# Patient Record
Sex: Male | Born: 1955 | Race: White | Hispanic: No | Marital: Married | Smoking: Former smoker
Health system: Southern US, Community
[De-identification: ages and names within clinical notes are randomized; demographics above are authoritative.]

## PROBLEM LIST (undated history)

## (undated) DIAGNOSIS — I10 Essential (primary) hypertension: Secondary | ICD-10-CM

## (undated) DIAGNOSIS — J189 Pneumonia, unspecified organism: Secondary | ICD-10-CM

## (undated) DIAGNOSIS — J45909 Unspecified asthma, uncomplicated: Secondary | ICD-10-CM

## (undated) DIAGNOSIS — N189 Chronic kidney disease, unspecified: Secondary | ICD-10-CM

## (undated) DIAGNOSIS — M359 Systemic involvement of connective tissue, unspecified: Secondary | ICD-10-CM

## (undated) DIAGNOSIS — J449 Chronic obstructive pulmonary disease, unspecified: Secondary | ICD-10-CM

## (undated) DIAGNOSIS — I2510 Atherosclerotic heart disease of native coronary artery without angina pectoris: Secondary | ICD-10-CM

## (undated) DIAGNOSIS — K219 Gastro-esophageal reflux disease without esophagitis: Secondary | ICD-10-CM

## (undated) DIAGNOSIS — IMO0001 Reserved for inherently not codable concepts without codable children: Secondary | ICD-10-CM

## (undated) DIAGNOSIS — I219 Acute myocardial infarction, unspecified: Secondary | ICD-10-CM

## (undated) HISTORY — PX: CORONARY ANGIOPLASTY WITH STENT PLACEMENT: SHX49

## (undated) HISTORY — PX: APPENDECTOMY: SHX54

---

## 1981-01-21 DIAGNOSIS — J189 Pneumonia, unspecified organism: Secondary | ICD-10-CM

## 1981-01-21 HISTORY — DX: Pneumonia, unspecified organism: J18.9

## 2004-02-07 ENCOUNTER — Inpatient Hospital Stay: Payer: Self-pay | Admitting: Internal Medicine

## 2005-01-21 HISTORY — PX: APPENDECTOMY: SHX54

## 2010-01-21 HISTORY — PX: CORONARY ANGIOPLASTY WITH STENT PLACEMENT: SHX49

## 2010-02-21 ENCOUNTER — Inpatient Hospital Stay: Payer: Self-pay | Admitting: Internal Medicine

## 2014-06-21 ENCOUNTER — Observation Stay
Admission: EM | Admit: 2014-06-21 | Discharge: 2014-06-23 | Disposition: A | Discharge status: Home / Self Care | Payer: Self-pay | Attending: Internal Medicine | Admitting: Internal Medicine

## 2014-06-21 ENCOUNTER — Encounter: Payer: Self-pay | Admitting: *Deleted

## 2014-06-21 ENCOUNTER — Emergency Department: Payer: Self-pay

## 2014-06-21 DIAGNOSIS — R9389 Abnormal findings on diagnostic imaging of other specified body structures: Secondary | ICD-10-CM | POA: Diagnosis present

## 2014-06-21 DIAGNOSIS — Z8249 Family history of ischemic heart disease and other diseases of the circulatory system: Secondary | ICD-10-CM | POA: Insufficient documentation

## 2014-06-21 DIAGNOSIS — E785 Hyperlipidemia, unspecified: Secondary | ICD-10-CM | POA: Insufficient documentation

## 2014-06-21 DIAGNOSIS — M79602 Pain in left arm: Secondary | ICD-10-CM | POA: Insufficient documentation

## 2014-06-21 DIAGNOSIS — K769 Liver disease, unspecified: Secondary | ICD-10-CM

## 2014-06-21 DIAGNOSIS — R079 Chest pain, unspecified: Secondary | ICD-10-CM

## 2014-06-21 DIAGNOSIS — R918 Other nonspecific abnormal finding of lung field: Secondary | ICD-10-CM | POA: Insufficient documentation

## 2014-06-21 DIAGNOSIS — K219 Gastro-esophageal reflux disease without esophagitis: Secondary | ICD-10-CM | POA: Insufficient documentation

## 2014-06-21 DIAGNOSIS — Z79899 Other long term (current) drug therapy: Secondary | ICD-10-CM | POA: Insufficient documentation

## 2014-06-21 DIAGNOSIS — I2510 Atherosclerotic heart disease of native coronary artery without angina pectoris: Principal | ICD-10-CM | POA: Insufficient documentation

## 2014-06-21 DIAGNOSIS — N189 Chronic kidney disease, unspecified: Secondary | ICD-10-CM | POA: Insufficient documentation

## 2014-06-21 DIAGNOSIS — I714 Abdominal aortic aneurysm, without rupture: Secondary | ICD-10-CM | POA: Insufficient documentation

## 2014-06-21 DIAGNOSIS — D72829 Elevated white blood cell count, unspecified: Secondary | ICD-10-CM | POA: Insufficient documentation

## 2014-06-21 DIAGNOSIS — R0602 Shortness of breath: Secondary | ICD-10-CM | POA: Insufficient documentation

## 2014-06-21 DIAGNOSIS — I10 Essential (primary) hypertension: Secondary | ICD-10-CM | POA: Diagnosis present

## 2014-06-21 DIAGNOSIS — Z87891 Personal history of nicotine dependence: Secondary | ICD-10-CM | POA: Insufficient documentation

## 2014-06-21 DIAGNOSIS — R0789 Other chest pain: Secondary | ICD-10-CM | POA: Insufficient documentation

## 2014-06-21 DIAGNOSIS — Z801 Family history of malignant neoplasm of trachea, bronchus and lung: Secondary | ICD-10-CM | POA: Insufficient documentation

## 2014-06-21 DIAGNOSIS — I252 Old myocardial infarction: Secondary | ICD-10-CM | POA: Insufficient documentation

## 2014-06-21 DIAGNOSIS — I129 Hypertensive chronic kidney disease with stage 1 through stage 4 chronic kidney disease, or unspecified chronic kidney disease: Secondary | ICD-10-CM | POA: Insufficient documentation

## 2014-06-21 DIAGNOSIS — Z955 Presence of coronary angioplasty implant and graft: Secondary | ICD-10-CM | POA: Insufficient documentation

## 2014-06-21 DIAGNOSIS — R61 Generalized hyperhidrosis: Secondary | ICD-10-CM | POA: Insufficient documentation

## 2014-06-21 DIAGNOSIS — J449 Chronic obstructive pulmonary disease, unspecified: Secondary | ICD-10-CM | POA: Insufficient documentation

## 2014-06-21 HISTORY — DX: Gastro-esophageal reflux disease without esophagitis: K21.9

## 2014-06-21 HISTORY — DX: Chronic obstructive pulmonary disease, unspecified: J44.9

## 2014-06-21 HISTORY — DX: Chronic kidney disease, unspecified: N18.9

## 2014-06-21 HISTORY — DX: Reserved for inherently not codable concepts without codable children: IMO0001

## 2014-06-21 HISTORY — DX: Acute myocardial infarction, unspecified: I21.9

## 2014-06-21 HISTORY — DX: Systemic involvement of connective tissue, unspecified: M35.9

## 2014-06-21 HISTORY — DX: Atherosclerotic heart disease of native coronary artery without angina pectoris: I25.10

## 2014-06-21 HISTORY — DX: Essential (primary) hypertension: I10

## 2014-06-21 HISTORY — DX: Unspecified asthma, uncomplicated: J45.909

## 2014-06-21 LAB — CBC WITH DIFFERENTIAL/PLATELET
BASOS ABS: 0 10*3/uL (ref 0–0.1)
Basophils Relative: 0 %
Eosinophils Absolute: 0 10*3/uL (ref 0–0.7)
Eosinophils Relative: 0 %
HCT: 42.9 % (ref 40.0–52.0)
HEMOGLOBIN: 14.2 g/dL (ref 13.0–18.0)
Lymphocytes Relative: 9 %
Lymphs Abs: 1.4 10*3/uL (ref 1.0–3.6)
MCH: 28 pg (ref 26.0–34.0)
MCHC: 33.1 g/dL (ref 32.0–36.0)
MCV: 84.8 fL (ref 80.0–100.0)
MONOS PCT: 16 %
Monocytes Absolute: 2.6 10*3/uL — ABNORMAL HIGH (ref 0.2–1.0)
NEUTROS ABS: 12.1 10*3/uL — AB (ref 1.4–6.5)
Neutrophils Relative %: 75 %
Platelets: 277 10*3/uL (ref 150–440)
RBC: 5.06 MIL/uL (ref 4.40–5.90)
RDW: 14.5 % (ref 11.5–14.5)
WBC: 16.1 10*3/uL — ABNORMAL HIGH (ref 3.8–10.6)

## 2014-06-21 LAB — BASIC METABOLIC PANEL
Anion gap: 8 (ref 5–15)
BUN: 17 mg/dL (ref 6–20)
CO2: 27 mmol/L (ref 22–32)
Calcium: 9 mg/dL (ref 8.9–10.3)
Chloride: 101 mmol/L (ref 101–111)
Creatinine, Ser: 1.5 mg/dL — ABNORMAL HIGH (ref 0.61–1.24)
GFR calc Af Amer: 58 mL/min — ABNORMAL LOW (ref >60)
GFR, EST NON AFRICAN AMERICAN: 50 mL/min — AB (ref >60)
GLUCOSE: 113 mg/dL — AB (ref 65–99)
POTASSIUM: 3.8 mmol/L (ref 3.5–5.1)
Sodium: 136 mmol/L (ref 135–145)

## 2014-06-21 LAB — TROPONIN I: Troponin I: <0.03 ng/mL (ref <0.031)

## 2014-06-21 MED ORDER — IOHEXOL 350 MG/ML SOLN
75.0000 mL | Freq: Once | INTRAVENOUS | Status: AC | PRN
  Administered 2014-06-21: 75 mL via INTRAVENOUS

## 2014-06-21 MED ORDER — SODIUM CHLORIDE 0.9 % IV BOLUS (SEPSIS)
1000.0000 mL | Freq: Once | INTRAVENOUS | Status: AC
  Administered 2014-06-21: 1000 mL via INTRAVENOUS

## 2014-06-21 NOTE — ED Notes (Addendum)
Per EMS report, patient first experienced chest pain at 0530 this morning and has been on and off since then. Patient c/o shortness of breath this evening. Patient was given two sprays of nitro SL, first brought chest pain from 4/10 to 1/10, 2nd spray to 0/10. Patient has a hx. Of MI and one stent. Patient states he took (3) '325mg'$  Aspirin today, but has not taken Plavix for two weeks. Patient also c/o diaphoresis and left arm pain.

## 2014-06-21 NOTE — ED Provider Notes (Signed)
[facility] Emergency Department Provider Note  ____________________________________________  Time seen: Approximately 940 PM  I have reviewed the triage vital signs and the nursing notes.   HISTORY  Chief Complaint Chest Pain    HPI Terry Freeman is a 59 y.o. male with a history of CAD with a stent in 2012 who presents with aching left-sided chest pain which radiated to his back this morning at 5:30 AM. He said it has been coming and going throughout the day and was relieved by ranging his left arm. He said that he did have some diaphoresis associated with it but no nausea or vomiting. He said that this felt differently than his pain in 2012 the cousin 2012 his pain was associated with abdominal aching. He says he has not been on his Plavix for the past 2 weeks because he said he ran out and didn't pick it up at the pharmacy. He sees Dr. Yancey Flemings in call wouldn't of cardiology here at [facility]. He took 3, 325 mg aspirin throughout the day today. He then called EMS who gave him a nitroglycerin sublingual which relieved the pain. He is not claiming any pain at this time.   No past medical history on file.  There are no active problems to display for this patient.   No past surgical history on file.  No current outpatient prescriptions on file.  Allergies Review of patient's allergies indicates no known allergies.  No family history on file.  Social History History  Substance Use Topics  . Smoking status: Not on file  . Smokeless tobacco: Not on file  . Alcohol Use: Not on file    Review of Systems Constitutional: No fever/chills Eyes: No visual changes. ENT: No sore throat. Cardiovascular: Chest pain as above  Respiratory: Denies shortness of breath. Gastrointestinal: No abdominal pain.  No nausea, no vomiting.  No diarrhea.  No constipation. Genitourinary: Negative for dysuria. Musculoskeletal: Negative for back pain. Skin: Negative for  rash. Neurological: Negative for headaches, focal weakness or numbness.  10-point ROS otherwise negative.  ____________________________________________   PHYSICAL EXAM:  VITAL SIGNS: ED Triage Vitals  Enc Vitals Group     BP 06/21/14 2142 96/72 mmHg     Pulse Rate 06/21/14 2142 104     Resp --      Temp 06/21/14 2142 98.7 F (37.1 C)     Temp Source 06/21/14 2142 Oral     SpO2 06/21/14 2142 94 %     Weight 06/21/14 2142 218 lb (98.884 kg)     Height 06/21/14 2142 6' (1.829 m)     Head Cir --      Peak Flow --      Pain Score --      Pain Loc --      Pain Edu? --      Excl. in [place]? --     Constitutional: Alert and oriented. Well appearing and in no acute distress. Eyes: Conjunctivae are normal. PERRL. EOMI. Head: Atraumatic. Nose: No congestion/rhinnorhea. Mouth/Throat: Mucous membranes are moist.  Oropharynx non-erythematous. Neck: No stridor.   Cardiovascular: Normal rate, regular rhythm. Grossly normal heart sounds.  Good peripheral circulation. Respiratory: Normal respiratory effort.  No retractions. Lungs CTAB. Gastrointestinal: Soft and nontender. No distention. No abdominal bruits. No CVA tenderness. Musculoskeletal: No lower extremity tenderness nor edema.  No joint effusions. Neurologic:  Normal speech and language. No gross focal neurologic deficits are appreciated. Speech is normal. No gait instability. Skin:  Skin is warm,  dry and intact. No rash noted. Psychiatric: Mood and affect are normal. Speech and behavior are normal.  ____________________________________________   LABS (all labs ordered are listed, but only abnormal results are displayed)  Labs Reviewed  CBC WITH DIFFERENTIAL/PLATELET  BASIC METABOLIC PANEL  TROPONIN I   ____________________________________________  EKG  ED ECG REPORT I, Nadyne Gariepy,  Youlanda Roys, the attending physician, personally viewed and interpreted this ECG.   Date: 06/21/2014  EKG Time: 2135  Rate: 101  Rhythm: sinus  tachycardia  Axis: Terry Freeman  Intervals:none  ST&T Change: No ST elevation or depressions. No abnormal T-wave inversions.  ____________________________________________  RADIOLOGY  8.57 m left lung mass with small pleural effusion. ____________________________________________   PROCEDURES   ____________________________________________   INITIAL IMPRESSION / ASSESSMENT AND PLAN / ED COURSE  Pertinent labs & imaging results that were available during my care of the patient were reviewed by me and considered in my medical decision making (see chart for details). ----------------------------------------- 11:05 PM on 06/21/2014 -----------------------------------------  Patient resting comfortably still pain-free. Told the patient about his x-ray result. Said had some sort of "dead lung" at [facility] several years ago but never had any further imaging after that. I reviewed his imaging at [facility] which noted some atelectasis but no definite mass. Patient aware that he'll be getting a CAT scan of his chest. We'll also send second troponin for trending. Patient is high risk for CAD especially since he has been off his Plavix for 2 weeks and has a stent. Patient signed out to Dr. Owens Shark for follow-up of his imaging and labs and disposition. ____________________________________________   FINAL CLINICAL IMPRESSION(S) / ED DIAGNOSES  Left-sided chest pain. Acute , initial visit.    Orbie Pyo, MD 06/21/14 [phone]

## 2014-06-22 ENCOUNTER — Other Ambulatory Visit: Payer: Self-pay

## 2014-06-22 ENCOUNTER — Encounter: Payer: Self-pay | Admitting: Internal Medicine

## 2014-06-22 ENCOUNTER — Inpatient Hospital Stay
Admit: 2014-06-22 | Discharge: 2014-06-22 | Disposition: A | Discharge status: Home / Self Care | Payer: Self-pay | Attending: Internal Medicine | Admitting: Internal Medicine

## 2014-06-22 DIAGNOSIS — E785 Hyperlipidemia, unspecified: Secondary | ICD-10-CM | POA: Diagnosis present

## 2014-06-22 DIAGNOSIS — J449 Chronic obstructive pulmonary disease, unspecified: Secondary | ICD-10-CM | POA: Diagnosis present

## 2014-06-22 DIAGNOSIS — Z79899 Other long term (current) drug therapy: Secondary | ICD-10-CM

## 2014-06-22 DIAGNOSIS — R9389 Abnormal findings on diagnostic imaging of other specified body structures: Secondary | ICD-10-CM | POA: Diagnosis present

## 2014-06-22 DIAGNOSIS — R918 Other nonspecific abnormal finding of lung field: Secondary | ICD-10-CM

## 2014-06-22 DIAGNOSIS — N189 Chronic kidney disease, unspecified: Secondary | ICD-10-CM

## 2014-06-22 DIAGNOSIS — Z87891 Personal history of nicotine dependence: Secondary | ICD-10-CM

## 2014-06-22 DIAGNOSIS — I252 Old myocardial infarction: Secondary | ICD-10-CM

## 2014-06-22 DIAGNOSIS — I10 Essential (primary) hypertension: Secondary | ICD-10-CM | POA: Insufficient documentation

## 2014-06-22 DIAGNOSIS — I998 Other disorder of circulatory system: Secondary | ICD-10-CM

## 2014-06-22 DIAGNOSIS — I2510 Atherosclerotic heart disease of native coronary artery without angina pectoris: Principal | ICD-10-CM

## 2014-06-22 DIAGNOSIS — R42 Dizziness and giddiness: Secondary | ICD-10-CM

## 2014-06-22 DIAGNOSIS — K219 Gastro-esophageal reflux disease without esophagitis: Secondary | ICD-10-CM

## 2014-06-22 DIAGNOSIS — I129 Hypertensive chronic kidney disease with stage 1 through stage 4 chronic kidney disease, or unspecified chronic kidney disease: Secondary | ICD-10-CM

## 2014-06-22 DIAGNOSIS — J45909 Unspecified asthma, uncomplicated: Secondary | ICD-10-CM

## 2014-06-22 DIAGNOSIS — R079 Chest pain, unspecified: Secondary | ICD-10-CM

## 2014-06-22 DIAGNOSIS — D72829 Elevated white blood cell count, unspecified: Secondary | ICD-10-CM | POA: Diagnosis present

## 2014-06-22 LAB — COMPREHENSIVE METABOLIC PANEL
ALT: 30 U/L (ref 17–63)
ANION GAP: 7 (ref 5–15)
AST: 28 U/L (ref 15–41)
Albumin: 3.7 g/dL (ref 3.5–5.0)
Alkaline Phosphatase: 63 U/L (ref 38–126)
BUN: 17 mg/dL (ref 6–20)
CALCIUM: 8.7 mg/dL — AB (ref 8.9–10.3)
CHLORIDE: 103 mmol/L (ref 101–111)
CO2: 27 mmol/L (ref 22–32)
Creatinine, Ser: 1.31 mg/dL — ABNORMAL HIGH (ref 0.61–1.24)
GFR calc Af Amer: >60 mL/min (ref >60)
GFR calc non Af Amer: 59 mL/min — ABNORMAL LOW (ref >60)
Glucose, Bld: 114 mg/dL — ABNORMAL HIGH (ref 65–99)
POTASSIUM: 3.6 mmol/L (ref 3.5–5.1)
Sodium: 137 mmol/L (ref 135–145)
TOTAL PROTEIN: 7.1 g/dL (ref 6.5–8.1)
Total Bilirubin: 1.3 mg/dL — ABNORMAL HIGH (ref 0.3–1.2)

## 2014-06-22 LAB — SEDIMENTATION RATE: Sed Rate: 17 mm/hr (ref 0–20)

## 2014-06-22 LAB — CBC
HCT: 41.5 % (ref 40.0–52.0)
HEMOGLOBIN: 14.4 g/dL (ref 13.0–18.0)
MCH: 29.4 pg (ref 26.0–34.0)
MCHC: 34.7 g/dL (ref 32.0–36.0)
MCV: 84.7 fL (ref 80.0–100.0)
Platelets: 246 10*3/uL (ref 150–440)
RBC: 4.9 MIL/uL (ref 4.40–5.90)
RDW: 14.4 % (ref 11.5–14.5)
WBC: 13.5 10*3/uL — AB (ref 3.8–10.6)

## 2014-06-22 LAB — TROPONIN I
Troponin I: <0.03 ng/mL (ref <0.031)
Troponin I: <0.03 ng/mL (ref <0.031)

## 2014-06-22 LAB — TSH: TSH: 0.762 u[IU]/mL (ref 0.350–4.500)

## 2014-06-22 MED ORDER — ALUM & MAG HYDROXIDE-SIMETH 200-200-20 MG/5ML PO SUSP
ORAL | Status: AC
  Filled 2014-06-22: qty 30

## 2014-06-22 MED ORDER — SODIUM CHLORIDE 0.9 % IJ SOLN: 3.0000 mL | INTRAMUSCULAR | Status: DC | PRN

## 2014-06-22 MED ORDER — ALBUTEROL SULFATE (2.5 MG/3ML) 0.083% IN NEBU: 2.5000 mg | INHALATION_SOLUTION | RESPIRATORY_TRACT | Status: DC | PRN

## 2014-06-22 MED ORDER — CLOPIDOGREL BISULFATE 75 MG PO TABS
75.0000 mg | ORAL_TABLET | Freq: Every day | ORAL | Status: DC
  Administered 2014-06-22 – 2014-06-23 (×2): 75 mg via ORAL
  Filled 2014-06-22 (×2): qty 1

## 2014-06-22 MED ORDER — HEPARIN SODIUM (PORCINE) 5000 UNIT/ML IJ SOLN
5000.0000 [IU] | Freq: Three times a day (TID) | INTRAMUSCULAR | Status: DC
  Administered 2014-06-22 – 2014-06-23 (×3): 5000 [IU] via SUBCUTANEOUS
  Filled 2014-06-22 (×4): qty 1

## 2014-06-22 MED ORDER — ACETAMINOPHEN 650 MG RE SUPP: 650.0000 mg | Freq: Four times a day (QID) | RECTAL | Status: DC | PRN

## 2014-06-22 MED ORDER — ACETAMINOPHEN 325 MG PO TABS: 650.0000 mg | ORAL_TABLET | Freq: Four times a day (QID) | ORAL | Status: DC | PRN

## 2014-06-22 MED ORDER — ASPIRIN EC 81 MG PO TBEC
81.0000 mg | DELAYED_RELEASE_TABLET | Freq: Every day | ORAL | Status: DC
  Administered 2014-06-22 – 2014-06-23 (×2): 81 mg via ORAL
  Filled 2014-06-22 (×2): qty 1

## 2014-06-22 MED ORDER — SODIUM CHLORIDE 0.9 % IJ SOLN: 3.0000 mL | Freq: Two times a day (BID) | INTRAMUSCULAR | Status: DC

## 2014-06-22 MED ORDER — BUDESONIDE-FORMOTEROL FUMARATE 160-4.5 MCG/ACT IN AERO
2.0000 | INHALATION_SPRAY | Freq: Two times a day (BID) | RESPIRATORY_TRACT | Status: DC
  Administered 2014-06-22 – 2014-06-23 (×3): 2 via RESPIRATORY_TRACT
  Filled 2014-06-22 (×2): qty 6

## 2014-06-22 MED ORDER — SODIUM CHLORIDE 0.9 % IJ SOLN
3.0000 mL | Freq: Two times a day (BID) | INTRAMUSCULAR | Status: DC
  Administered 2014-06-22 – 2014-06-23 (×2): 3 mL via INTRAVENOUS

## 2014-06-22 MED ORDER — SIMVASTATIN 40 MG PO TABS
40.0000 mg | ORAL_TABLET | Freq: Every day | ORAL | Status: DC
  Administered 2014-06-22: 40 mg via ORAL
  Filled 2014-06-22 (×2): qty 1

## 2014-06-22 MED ORDER — METOPROLOL TARTRATE 25 MG PO TABS
12.5000 mg | ORAL_TABLET | Freq: Every day | ORAL | Status: DC
  Administered 2014-06-22 – 2014-06-23 (×2): 12.5 mg via ORAL
  Filled 2014-06-22 (×2): qty 1

## 2014-06-22 MED ORDER — FAMOTIDINE 20 MG PO TABS
40.0000 mg | ORAL_TABLET | Freq: Every day | ORAL | Status: DC
  Administered 2014-06-22 – 2014-06-23 (×2): 40 mg via ORAL
  Filled 2014-06-22 (×2): qty 2

## 2014-06-22 MED ORDER — ONDANSETRON HCL 4 MG PO TABS: 4.0000 mg | ORAL_TABLET | Freq: Four times a day (QID) | ORAL | Status: DC | PRN

## 2014-06-22 MED ORDER — ALUM & MAG HYDROXIDE-SIMETH 200-200-20 MG/5ML PO SUSP
30.0000 mL | Freq: Once | ORAL | Status: AC
  Administered 2014-06-22: 30 mL via ORAL

## 2014-06-22 MED ORDER — ONDANSETRON HCL 4 MG/2ML IJ SOLN: 4.0000 mg | Freq: Four times a day (QID) | INTRAMUSCULAR | Status: DC | PRN

## 2014-06-22 MED ORDER — SODIUM CHLORIDE 0.9 % IV SOLN: 250.0000 mL | INTRAVENOUS | Status: DC | PRN

## 2014-06-22 NOTE — Progress Notes (Signed)
*  PRELIMINARY RESULTS* Echocardiogram 2D Echocardiogram has been performed.  Terry Freeman 06/22/2014, 9:29 AM

## 2014-06-22 NOTE — Plan of Care (Signed)
Problem: Phase I Progression Outcomes Goal: Anginal pain relieved Outcome: Progressing Patient admittaed with chest pain,troponins negative,hemodynamically stable,denies pain on admission,normal sinus rhythm. Goal: Other Phase I Outcomes/Goals Outcome: Progressing Patient admitted with chest pain,troponins negative,denies pain upon admission,vital signs stable.

## 2014-06-22 NOTE — Consult Note (Signed)
[place] @ [facility] [phone]  Fax:([phone]) [place] OB: 02/06/1955  MR#: [id number]  QQP#:[id number]  Patient Care Team: Teodoro Spray, MD as Consulting Physician (Cardiology)  CHIEF COMPLAINT:  Chief Complaint  Patient presents with  . Chest Pain    VISIT DIAGNOSIS:     ICD-9-CM ICD-10-CM   1. Chest pain, unspecified chest pain type 786.50 R07.9   2. Lung mass 786.6 R91.8 CT Chest W Contrast     CT Chest W Contrast     NM PET Image Initial (PI) Skull Base To Thigh     NM PET Image Initial (PI) Skull Base To Thigh  3. Liver lesion 573.8 K76.89 NM PET Image Initial (PI) Skull Base To Thigh     NM PET Image Initial (PI) Skull Base To Thigh      No history exists.    No flowsheet data found.  INTERVAL HISTORY: 59 year old gentleman who developed acute chest pain and left upper lobe chest pain along with dizziness.  Patient was brought to emergency room where CT scan of the chest was abnormal.  Patient was admitted in the hospital.  Pain has resolved.  Patient has a previous history of cardiac disease and stent placement in 2014. Patient has smoked in the past for 20 years but has not smoked for last several years now. No significant weight loss.  No hemoptysis. REVIEW OF SYSTEMS:   GENERAL:  Feels good.  Active.  No fevers, sweats or weight loss. PERFORMANCE STATUS (ECOG):  0 HEENT:  No visual changes, runny nose, sore throat, mouth sores or tenderness. Lungs: No shortness of breath or cough.  No hemoptysis. 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  We do not have any records available Cardiac:  No chest pain, palpitations, orthopnea, or PND. GI:  No nausea, vomiting, diarrhea, constipation, melena or hematochezia. GU:  No urgency, frequency, dysuria, or hematuria. Musculoskeletal:   No back pain.  No joint pain.  No muscle tenderness. Extremities:  No pain or swelling. Skin:  No rashes or skin changes. Neuro:  No headache, numbness or weakness, balance or coordination issues. Endocrine:  No diabetes, thyroid issues, hot flashes or night sweats. Psych:  No mood changes, depression or anxiety. Pain:  No focal pain. Review of systems:  All other systems reviewed and found to be negative.  As per HPI. Otherwise, a complete review of systems is negatve.  PAST MEDICAL HISTORY: Past Medical History  Diagnosis Date  . COPD (chronic obstructive pulmonary disease)   . Hypertension   . Reflux   . Coronary artery disease   . Myocardial infarction   . Asthma   . Collagen vascular disease   . Chronic kidney disease     PAST SURGICAL HISTORY: Past Surgical History  Procedure Laterality Date  . Appendectomy    . Coronary angioplasty with stent placement      FAMILY HISTORY Family History  Problem Relation Age of Onset  . Cancer - Lung Mother   . CAD Mother   . Cancer - Lung Father        ADVANCED DIRECTIVES: No advance care directive available   HEALTH MAINTENANCE: History  Substance Use Topics  . Smoking status: Former Research scientist (life sciences)  . Smokeless tobacco: Not on file  .  Alcohol Use: No       No Known Allergies  Current Facility-Administered Medications  Medication Dose Route Frequency Provider Last Rate Last Dose  . 0.9 %  sodium chloride infusion  250 mL Intravenous PRN Juluis Mire, MD      . acetaminophen (TYLENOL) tablet 650 mg  650 mg Oral Q6H PRN Juluis Mire, MD       Or  . acetaminophen (TYLENOL) suppository 650 mg  650 mg Rectal Q6H PRN Juluis Mire, MD      . albuterol (PROVENTIL) (2.5 MG/3ML) 0.083% nebulizer solution 2.5 mg  2.5 mg Nebulization Q2H PRN Juluis Mire, MD      . aspirin EC tablet 81 mg  81 mg Oral Daily Juluis Mire, MD   81 mg at 06/22/14 1012  . budesonide-formoterol (SYMBICORT) 160-4.5 MCG/ACT inhaler 2  puff  2 puff Inhalation BID Juluis Mire, MD   2 puff at 06/22/14 1356  . clopidogrel (PLAVIX) tablet 75 mg  75 mg Oral Daily Juluis Mire, MD   75 mg at 06/22/14 1012  . famotidine (PEPCID) tablet 40 mg  40 mg Oral Daily Juluis Mire, MD   40 mg at 06/22/14 1012  . heparin injection 5,000 Units  5,000 Units Subcutaneous 3 times per day Juluis Mire, MD   5,000 Units at 06/22/14 1356  . metoprolol tartrate (LOPRESSOR) tablet 12.5 mg  12.5 mg Oral Daily Juluis Mire, MD   12.5 mg at 06/22/14 1012  . ondansetron (ZOFRAN) tablet 4 mg  4 mg Oral Q6H PRN Juluis Mire, MD       Or  . ondansetron [place]) injection 4 mg  4 mg Intravenous Q6H PRN Juluis Mire, MD      . simvastatin (ZOCOR) tablet 40 mg  40 mg Oral Daily Juluis Mire, MD      . sodium chloride 0.9 % injection 3 mL  3 mL Intravenous Q12H Juluis Mire, MD   3 mL at 06/22/14 1000  . sodium chloride 0.9 % injection 3 mL  3 mL Intravenous Q12H Juluis Mire, MD   3 mL at 06/22/14 1000  . sodium chloride 0.9 % injection 3 mL  3 mL Intravenous PRN Juluis Mire, MD        OBJECTIVE: PHYSICAL EXAM: GENERAL:  Well developed, well nourished, sitting comfortably in the exam room in no acute distress. MENTAL STATUS:  Alert and oriented to person, place and time. HEAD Normocephalic, atraumatic, face symmetric, no Cushingoid features. EYES.  Pupils equal round and reactive to light and accomodation.  No conjunctivitis or scleral icterus. ENT:  Oropharynx clear without lesion.  Tongue normal. Mucous membranes moist.  RESPIRATORY:  Clear to auscultation without rales, wheezes or rhonchi. CARDIOVASCULAR:  Regular rate and rhythm without murmur, rub or gallop. BREAST:  Right breast without masses, skin changes or nipple discharge.  Left breast without masses, skin changes or nipple discharge. ABDOMEN:  Soft, non-tender, with active bowel sounds, and no hepatosplenomegaly.  No masses. BACK:  No CVA tenderness.   No tenderness on percussion of the back or rib cage. SKIN:  No rashes, ulcers or lesions. EXTREMITIES: No edema, no skin discoloration or tenderness.  No palpable cords. LYMPH NODES: No palpable cervical, supraclavicular, axillary or inguinal adenopathy  NEUROLOGICAL: Unremarkable. PSYCH:  Appropriate.  Filed Vitals:   06/22/14 1207  BP: 115/64  Pulse: 86  Temp: 98.5 F (36.9 C)  Resp: 20  Body mass index is 29.59 kg/(m^2).    ECOG FS:0 - Asymptomatic  LAB RESULTS:  Admission on 06/21/2014  Component Date Value Ref Range Status  . WBC 06/21/2014 16.1* 3.8 - 10.6 K/uL Final  . RBC 06/21/2014 5.06  4.40 - 5.90 MIL/uL Final  . Hemoglobin 06/21/2014 14.2  13.0 - 18.0 g/dL Final  . HCT 06/21/2014 42.9  40.0 - 52.0 % Final  . MCV 06/21/2014 84.8  80.0 - 100.0 fL Final  . MCH 06/21/2014 28.0  26.0 - 34.0 pg Final  . MCHC 06/21/2014 33.1  32.0 - 36.0 g/dL Final  . RDW 06/21/2014 14.5  11.5 - 14.5 % Final  . Platelets 06/21/2014 277  150 - 440 K/uL Final  . Neutrophils Relative % 06/21/2014 75   Final  . Lymphocytes Relative 06/21/2014 9   Final  . Monocytes Relative 06/21/2014 16   Final  . Eosinophils Relative 06/21/2014 0   Final  . Basophils Relative 06/21/2014 0   Final  . Neutro Abs 06/21/2014 12.1* 1.4 - 6.5 K/uL Final  . Lymphs Abs 06/21/2014 1.4  1.0 - 3.6 K/uL Final  . Monocytes Absolute 06/21/2014 2.6* 0.2 - 1.0 K/uL Final  . Eosinophils Absolute 06/21/2014 0.0  0 - 0.7 K/uL Final  . Basophils Absolute 06/21/2014 0.0  0 - 0.1 K/uL Final  . Sodium 06/21/2014 136  135 - 145 mmol/L Final  . Potassium 06/21/2014 3.8  3.5 - 5.1 mmol/L Final  . Chloride 06/21/2014 101  101 - 111 mmol/L Final  . CO2 06/21/2014 27  22 - 32 mmol/L Final  . Glucose, Bld 06/21/2014 113* 65 - 99 mg/dL Final  . BUN 06/21/2014 17  6 - 20 mg/dL Final  . Creatinine, Ser 06/21/2014 1.50* 0.61 - 1.24 mg/dL Final  . Calcium 06/21/2014 9.0  8.9 - 10.3 mg/dL Final  . GFR calc non Af Amer 06/21/2014 50*  >60 mL/min Final  . GFR calc Af Amer 06/21/2014 58* >60 mL/min Final   Comment: (NOTE) The eGFR has been calculated using the CKD EPI equation. This calculation has not been validated in all clinical situations. eGFR's persistently <60 mL/min signify possible Chronic Kidney Disease.   . Anion gap 06/21/2014 8  5 - 15 Final  . Troponin I 06/21/2014 <0.03  <0.031 ng/mL Final   Comment:        NO INDICATION OF MYOCARDIAL INJURY.   . Troponin I 06/22/2014 <0.03  <0.031 ng/mL Final   Comment:        NO INDICATION OF MYOCARDIAL INJURY.   Marland Kitchen Specimen Description 06/22/2014 BLOOD   Final  . Special Requests 06/22/2014 NONE   Final  . Culture 06/22/2014 NO GROWTH < 12 HOURS   Final  . Report Status 06/22/2014 PENDING   Incomplete  . Specimen Description 06/22/2014 BLOOD   Final  . Special Requests 06/22/2014 NONE   Final  . Culture 06/22/2014 NO GROWTH < 12 HOURS   Final  . Report Status 06/22/2014 PENDING   Incomplete  . TSH 06/22/2014 0.762  0.350 - 4.500 uIU/mL Final  . Troponin I 06/22/2014 <0.03  <0.031 ng/mL Final   Comment:        NO INDICATION OF MYOCARDIAL INJURY.   . Troponin I 06/22/2014 <0.03  <0.031 ng/mL Final   Comment:        NO INDICATION OF MYOCARDIAL INJURY.   . Sodium 06/22/2014 137  135 - 145 mmol/L Final  . Potassium 06/22/2014 3.6  3.5 - 5.1 mmol/L  Final  . Chloride 06/22/2014 103  101 - 111 mmol/L Final  . CO2 06/22/2014 27  22 - 32 mmol/L Final  . Glucose, Bld 06/22/2014 114* 65 - 99 mg/dL Final  . BUN 06/22/2014 17  6 - 20 mg/dL Final  . Creatinine, Ser 06/22/2014 1.31* 0.61 - 1.24 mg/dL Final  . Calcium 06/22/2014 8.7* 8.9 - 10.3 mg/dL Final  . Total Protein 06/22/2014 7.1  6.5 - 8.1 g/dL Final  . Albumin 06/22/2014 3.7  3.5 - 5.0 g/dL Final  . AST 06/22/2014 28  15 - 41 U/L Final  . ALT 06/22/2014 30  17 - 63 U/L Final  . Alkaline Phosphatase 06/22/2014 63  38 - 126 U/L Final  . Total Bilirubin 06/22/2014 1.3* 0.3 - 1.2 mg/dL Final  . GFR calc non  Af Amer 06/22/2014 59* >60 mL/min Final  . GFR calc Af Amer 06/22/2014 >60  >60 mL/min Final   Comment: (NOTE) The eGFR has been calculated using the CKD EPI equation. This calculation has not been validated in all clinical situations. eGFR's persistently <60 mL/min signify possible Chronic Kidney Disease.   . Anion gap 06/22/2014 7  5 - 15 Final  . WBC 06/22/2014 13.5* 3.8 - 10.6 K/uL Final  . RBC 06/22/2014 4.90  4.40 - 5.90 MIL/uL Final  . Hemoglobin 06/22/2014 14.4  13.0 - 18.0 g/dL Final  . HCT 06/22/2014 41.5  40.0 - 52.0 % Final  . MCV 06/22/2014 84.7  80.0 - 100.0 fL Final  . MCH 06/22/2014 29.4  26.0 - 34.0 pg Final  . MCHC 06/22/2014 34.7  32.0 - 36.0 g/dL Final  . RDW 06/22/2014 14.4  11.5 - 14.5 % Final  . Platelets 06/22/2014 246  150 - 440 K/uL Final  . Sed Rate 06/22/2014 17  0 - 20 mm/hr Final     STUDIES: Dg Chest 2 View  06/21/2014   CLINICAL DATA:  Chest pain and shortness of breath.  EXAM: CHEST  2 VIEW  COMPARISON:  02/21/2010 chest radiograph  FINDINGS: An approximately 8.5 cm opacity overlying the medial anterior left lung is noted. Chest CT is recommended for further evaluation.  COPD/emphysema is identified.  Right apical opacity/scarring is noted.  A probable small effusion is noted.  There is no evidence of pneumothorax or acute bony abnormality.  IMPRESSION: 8.5 cm opacity overlying the medial anterior left lung- chest CT is recommended for further evaluation.  Question small pleural effusion.  COPD/ emphysema.   Electronically Signed   By: Margarette Canada M.D.   On: 06/21/2014 22:22   Ct Chest W Contrast  06/21/2014   CLINICAL DATA:  Acute onset of intermittent chest pain and shortness of breath. Diaphoresis and left arm pain. Initial encounter.  EXAM: CT CHEST WITH CONTRAST  TECHNIQUE: Multidetector CT imaging of the chest was performed during intravenous contrast administration.  CONTRAST:  59m OMNIPAQUE IOHEXOL 350 MG/ML SOLN  COMPARISON:  Chest radiograph  performed earlier today at 10:01 p.m.  FINDINGS: There is a large 9.2 x 8.2 x 6.5 cm complex cyst with multiple septations at the anterior left lateral aspect of the mediastinum. The differential for a cystic mass in the mediastinum includes a pericardial cyst, thymic cyst, lymphangioma or possibly cystic teratoma. Its location is atypical for other etiologies. Malignancy is considered less likely, though it appears associated with scarring and pleural thickening near the left lung apex. Mild associated soft tissue edema is noted within the adjacent fat.  Diffuse emphysematous change is noted  bilaterally, with large bullae seen at the lung apices. Focal opacification at the right lung apex measures 5.7 x 2.4 cm, with surrounding scarring, pleural thickening and multiple associated nodules measuring up to 9 mm in size. Given the scattered nodules within the right upper lobe, this is suspicious for primary malignancy. Biopsy and PET/CT would be helpful for further evaluation, as deemed clinically appropriate.  A trace left pleural effusion is noted. No additional pulmonary nodules are seen within the remaining lung lobes. No pneumothorax is identified.  Scattered coronary artery calcifications are seen. The mediastinum is otherwise grossly unremarkable. No mediastinal lymphadenopathy is seen. A 0.9 cm node at the aortopulmonary window remains within normal limits in size. The great vessels are unremarkable in appearance. The thyroid gland is unremarkable. No axillary lymphadenopathy is seen.  The visualized portions of the liver and spleen are unremarkable. Minimally increased attenuation dependently within the gallbladder could reflect small stones. The visualized portions of the gallbladder, pancreas, adrenal glands and left kidney are otherwise unremarkable.  IMPRESSION: 1. Focal opacification at the right lung apex measures 5.7 x 2.4 cm, with surrounding scarring, pleural thickening, and multiple associated  nodules measuring up to 9 mm in size. Given the scattered nodules in the right upper lobe, this suspicious for primary malignancy. Biopsy and PET/CT would be helpful for further evaluation, as deemed clinically appropriate. 2. Large 9.2 x 8.2 x 6.5 cm complex cyst with multiple septations at the anterior left lateral aspect of the mediastinum. This could reflect a pericardial cyst, thymic cyst, lymphangioma possibly cystic teratoma. Cystic lesions within the mediastinum tend not to be malignant, though there is some degree of associated scarring and pleural thickening near the left lung apex. Mild adjacent associated soft tissue edema noted. 3. Trace left pleural effusion noted, of uncertain significance. 4. Diffuse emphysematous change noted bilaterally, with large bullae at the lung apices. 5. Scattered coronary artery calcifications seen. 6. Question of mild cholelithiasis; visualized portions of the gallbladder are otherwise unremarkable.   Electronically Signed   By: Garald Balding M.D.   On: 06/21/2014 23:35    ASSESSMENT:  Episode of chest pain. CT scan has been abnormal as been reviewed independently there are abnormality of the right upper lobe opacification and left upper lobe complex  CYST  Patient has a PET scan scheduled this afternoon PET scan would be reviewed. This would be discussed tomorrow in case conference depending on the PET scan result further evaluation whether bronchoscopy is needed or not will be decided. Scars that with the patient and he is in agreement with it   PLAN:  No problem-specific assessment & plan notes found for this encounter.   Patient expressed understanding and was in agreement with this plan. He also understands that He can call clinic at any time with any questions, concerns, or complaints.    No matching staging information was found for the patient.  Forest Gleason, MD   06/22/2014 4:58 PM

## 2014-06-22 NOTE — Consult Note (Signed)
CARDIOLOGY CONSULT NOTE  Patient ID: Terry Freeman MRN: [id number] DOB/AGE: 57-24-1957 57 y.o.  Admit date: 06/21/2014 Referring Physician Volanda Napoleon Primary Physician   Primary Cardiologist [facility] Reason for Consultation Chest pain  HPI: 59 year old male with history of the the coronary artery disease status post PCI in 2012 after non ST elevation myocardial infarction.  He has done fairly well until recently began noting the left-sided chest pain.  This occurs when laying on his left side and resolves immediately after rolling back to his back.  He denies an exertional component.  He denies orthopnea PND.  He denies syncope.  He has been compliant with medications.    Chest CT revealed the a E9.2 x 8.2 x 6.5 cm complex cyst with multiple septations at the anterior left lateral aspect of the mediastinum.Focal opacification at the right lung apex measures 5.7 x 2.4 cm, with surrounding scarring, pleural thickening and multiple associated nodules measuring up to 9 mm in size. Given the scattered nodules within the right upper lobe, this is suspicious for primary malignancy.  The PET scan is pending.  Review of Systems  Constitutional: Positive for weight loss. Negative for fever and chills.  HENT: Negative for ear discharge and ear pain.   Eyes: Negative for blurred vision, double vision and pain.  Respiratory: Positive for shortness of breath. Negative for cough, hemoptysis, sputum production, wheezing and stridor.   Cardiovascular: Positive for chest pain. Negative for palpitations, orthopnea, claudication, leg swelling and PND.  Gastrointestinal: Negative for heartburn, nausea, abdominal pain and blood in stool.  Musculoskeletal: Negative for myalgias and back pain.  Skin: Negative for itching and rash.  Neurological: Negative for dizziness and headaches.  Psychiatric/Behavioral: Negative for hallucinations and memory loss.    Past Medical History  Diagnosis Date  . COPD (chronic  obstructive pulmonary disease)   . Hypertension   . Reflux   . Coronary artery disease   . Myocardial infarction   . Asthma   . Collagen vascular disease   . Chronic kidney disease     Family History  Problem Relation Age of Onset  . Cancer - Lung Mother   . CAD Mother   . Cancer - Lung Father     History   Social History  . Marital Status: Married    Spouse Name: N/A  . Number of Children: N/A  . Years of Education: N/A   Occupational History  . Not on file.   Social History Main Topics  . Smoking status: Former Research scientist (life sciences)  . Smokeless tobacco: Not on file  . Alcohol Use: No  . Drug Use: Not on file  . Sexual Activity: Not on file   Other Topics Concern  . Not on file   Social History Narrative    Past Surgical History  Procedure Laterality Date  . Appendectomy    . Coronary angioplasty with stent placement       Prescriptions prior to admission  Medication Sig Dispense Refill Last Dose  . aspirin 81 MG tablet Take 81 mg by mouth daily.     . clopidogrel (PLAVIX) 75 MG tablet Take 75 mg by mouth daily.   06/21/2014 at Unknown time  . metoprolol tartrate (LOPRESSOR) 25 MG tablet Take 12.5 mg by mouth daily.   06/21/2014 at Unknown time  . ranitidine (ZANTAC) 150 MG tablet Take 150 mg by mouth 2 (two) times daily.   06/21/2014 at Unknown time  . simvastatin (ZOCOR) 40 MG tablet Take 40 mg by  mouth daily.   06/21/2014 at Unknown time    Physical Exam: Blood pressure 115/64, pulse 86, temperature 98.5 F (36.9 C), temperature source Oral, resp. rate 20, height 6' (1.829 m), weight 98.975 kg (218 lb 3.2 oz), SpO2 94 %.  General appearance: alert and cooperative Head: Normocephalic, without obvious abnormality, atraumatic Back: symmetric, no curvature. ROM normal. No CVA tenderness. Resp: clear to auscultation bilaterally Chest wall: no tenderness Cardio: regular rate and rhythm, S1, S2 normal, no murmur, click, rub or gallop GI: soft, non-tender; bowel sounds normal;  no masses,  no organomegaly Pulses: 2+ and symmetric Neurologic: Grossly normal Labs:   Lab Results  Component Value Date   WBC 13.5* 06/22/2014   HGB 14.4 06/22/2014   HCT 41.5 06/22/2014   MCV 84.7 06/22/2014   PLT 246 06/22/2014    Recent Labs Lab 06/22/14 0546  NA 137  K 3.6  CL 103  CO2 27  BUN 17  CREATININE 1.31*  CALCIUM 8.7*  PROT 7.1  BILITOT 1.3*  ALKPHOS 63  ALT 30  AST 28  GLUCOSE 114*   Lab Results  Component Value Date   TROPONINI <0.03 06/22/2014      Radiology:   Has per above  In the HPI.  EKG:  The no evidence of ischemia with normal sinus rhythm  ASSESSMENT AND PLAN:   Patient with history of PCI in the past with atypical chest pain.  He is ruled out from myocardia infarction.  EKG is unremarkable.  Symptoms and clinical picture does not appear to represent acute ischemia.  Would continue with current regimen.  Left lung mass was noted on chest x-ray, chest CT and PET scan is pending.  This will need to be evaluated.  Etiology of his symptoms appear to be secondary to his lawn lesion rather than ischemia.  No further invasive cardiac workup indicated at present.  Should the patient require surgical intervention, functional study could be carried out at that time.  Echocardiogram revealed normal LV function with no wall motion abnormalities or valvular abnormalities. Signed: Teodoro Spray MD, [facility] 06/22/2014, 1:41 PM

## 2014-06-22 NOTE — Progress Notes (Signed)
[place] [facility] [place] NAME: Terry Freeman    MR#:  [id number]  DATE OF BIRTH:  [birth year]  SUBJECTIVE:  CHIEF COMPLAINT:   Chief Complaint  Patient presents with  . Chest Pain   Chest pain completely resolved at this time. No shortness of breath. Does note that when he lays on his left side he feels a soreness deep in the left chest.  REVIEW OF SYSTEMS:   Review of Systems  Constitutional: Negative for fever.  Respiratory: Negative for cough, shortness of breath and wheezing.   Cardiovascular: Negative for chest pain and palpitations.  Gastrointestinal: Negative for nausea, vomiting and abdominal pain.  Genitourinary: Negative for dysuria.    DRUG ALLERGIES:  No Known Allergies  VITALS:  Blood pressure 115/64, pulse 86, temperature 98.5 F (36.9 C), temperature source Oral, resp. rate 20, height 6' (1.829 m), weight 98.975 kg (218 lb 3.2 oz), SpO2 94 %.  PHYSICAL EXAMINATION:  GENERAL:  59 y.o.-year-old patient lying in the bed with no acute distress.  EYES: Pupils equal, round, reactive to light and accommodation. No scleral icterus. Extraocular muscles intact.  HEENT: Head atraumatic, normocephalic. Oropharynx and nasopharynx clear.  NECK:  Supple, no jugular venous distention. No thyroid enlargement, no tenderness.  LUNGS: Normal breath sounds bilaterally, no wheezing, rales,rhonchi or crepitation. No use of accessory muscles of respiration.  CARDIOVASCULAR: S1, S2 normal. No murmurs, rubs, or gallops.  ABDOMEN: Soft, nontender, nondistended. Bowel sounds present. No organomegaly or mass.  EXTREMITIES: No pedal edema, cyanosis, or clubbing.  NEUROLOGIC: Cranial nerves II through XII are intact. Muscle strength 5/5 in all extremities. Sensation intact. Gait not checked.  PSYCHIATRIC: The patient is alert and oriented x 3.  SKIN: No obvious rash, lesion, or ulcer.    LABORATORY PANEL:   CBC  Recent Labs Lab  06/22/14 0546  WBC 13.5*  HGB 14.4  HCT 41.5  PLT 246   ------------------------------------------------------------------------------------------------------------------  Chemistries   Recent Labs Lab 06/22/14 0546  NA 137  K 3.6  CL 103  CO2 27  GLUCOSE 114*  BUN 17  CREATININE 1.31*  CALCIUM 8.7*  AST 28  ALT 30  ALKPHOS 63  BILITOT 1.3*   ------------------------------------------------------------------------------------------------------------------  Cardiac Enzymes  Recent Labs Lab 06/22/14 1133  TROPONINI <0.03   ------------------------------------------------------------------------------------------------------------------  RADIOLOGY:  Dg Chest 2 View  06/21/2014   CLINICAL DATA:  Chest pain and shortness of breath.  EXAM: CHEST  2 VIEW  COMPARISON:  02/21/2010 chest radiograph  FINDINGS: An approximately 8.5 cm opacity overlying the medial anterior left lung is noted. Chest CT is recommended for further evaluation.  COPD/emphysema is identified.  Right apical opacity/scarring is noted.  A probable small effusion is noted.  There is no evidence of pneumothorax or acute bony abnormality.  IMPRESSION: 8.5 cm opacity overlying the medial anterior left lung- chest CT is recommended for further evaluation.  Question small pleural effusion.  COPD/ emphysema.   Electronically Signed   By: Margarette Canada M.D.   On: 06/21/2014 22:22   Ct Chest W Contrast  06/21/2014   CLINICAL DATA:  Acute onset of intermittent chest pain and shortness of breath. Diaphoresis and left arm pain. Initial encounter.  EXAM: CT CHEST WITH CONTRAST  TECHNIQUE: Multidetector CT imaging of the chest was performed during intravenous contrast administration.  CONTRAST:  23m OMNIPAQUE IOHEXOL 350 MG/ML SOLN  COMPARISON:  Chest radiograph performed earlier today at 10:01 p.m.  FINDINGS: There is a large 9.2  x 8.2 x 6.5 cm complex cyst with multiple septations at the anterior left lateral aspect of the  mediastinum. The differential for a cystic mass in the mediastinum includes a pericardial cyst, thymic cyst, lymphangioma or possibly cystic teratoma. Its location is atypical for other etiologies. Malignancy is considered less likely, though it appears associated with scarring and pleural thickening near the left lung apex. Mild associated soft tissue edema is noted within the adjacent fat.  Diffuse emphysematous change is noted bilaterally, with large bullae seen at the lung apices. Focal opacification at the right lung apex measures 5.7 x 2.4 cm, with surrounding scarring, pleural thickening and multiple associated nodules measuring up to 9 mm in size. Given the scattered nodules within the right upper lobe, this is suspicious for primary malignancy. Biopsy and PET/CT would be helpful for further evaluation, as deemed clinically appropriate.  A trace left pleural effusion is noted. No additional pulmonary nodules are seen within the remaining lung lobes. No pneumothorax is identified.  Scattered coronary artery calcifications are seen. The mediastinum is otherwise grossly unremarkable. No mediastinal lymphadenopathy is seen. A 0.9 cm node at the aortopulmonary window remains within normal limits in size. The great vessels are unremarkable in appearance. The thyroid gland is unremarkable. No axillary lymphadenopathy is seen.  The visualized portions of the liver and spleen are unremarkable. Minimally increased attenuation dependently within the gallbladder could reflect small stones. The visualized portions of the gallbladder, pancreas, adrenal glands and left kidney are otherwise unremarkable.  IMPRESSION: 1. Focal opacification at the right lung apex measures 5.7 x 2.4 cm, with surrounding scarring, pleural thickening, and multiple associated nodules measuring up to 9 mm in size. Given the scattered nodules in the right upper lobe, this suspicious for primary malignancy. Biopsy and PET/CT would be helpful for  further evaluation, as deemed clinically appropriate. 2. Large 9.2 x 8.2 x 6.5 cm complex cyst with multiple septations at the anterior left lateral aspect of the mediastinum. This could reflect a pericardial cyst, thymic cyst, lymphangioma possibly cystic teratoma. Cystic lesions within the mediastinum tend not to be malignant, though there is some degree of associated scarring and pleural thickening near the left lung apex. Mild adjacent associated soft tissue edema noted. 3. Trace left pleural effusion noted, of uncertain significance. 4. Diffuse emphysematous change noted bilaterally, with large bullae at the lung apices. 5. Scattered coronary artery calcifications seen. 6. Question of mild cholelithiasis; visualized portions of the gallbladder are otherwise unremarkable.   Electronically Signed   By: Garald Balding M.D.   On: 06/21/2014 23:35    EKG:   Orders placed or performed during the hospital encounter of 06/21/14  . ED EKG  . ED EKG  . EKG 12-Lead  . EKG 12-Lead    ASSESSMENT AND PLAN:   Principal Problem:   Chest pain Active Problems:   Abnormal CT scan, chest   CAD (coronary artery disease)   Leucocytosis   HTN (hypertension)   COPD (chronic obstructive pulmonary disease)   CKD (chronic kidney disease)   HLD (hyperlipidemia)  #1 chest pain: Resolved. Troponin negative 4. No EKG changes. Plavix  #2 right sided chest mass: He does report night sweats for the past year and decreased energy. He also states that at [facility] many years ago he was told that the upper right part of his lung was "completely dead". I do not see this in care everywhere. Reviewed CT with Dr. Mortimer Fries. Upper right lung mass may represent scar versus malignant nodule.  We'll order PET scan to evaluate for metabolic activity. Also has large mediastinal cyst and several lymph nodes. History of night sweats is concerning. Oncology consultation is pending.  #3 hypertension blood pressure slightly low. Monitor  closely. For now TO continue metoprolol #4 COPD: No exacerbation #5 CK D3: Stable #6 hyperlipidemia: Continue statin # 7 leukocytosis : improving. No signs of overt infection. Afebrile     All the records are reviewed and case discussed with Care Management/Social Workerr. Management plans discussed with the patient, family and they are in agreement.  CODE STATUS: Full  TOTAL TIME TAKING CARE OF THIS PATIENT: 40 minutes.   POSSIBLE D/C IN 1 DAYS, DEPENDING ON CLINICAL CONDITION.   Myrtis Ser M.D on 06/22/2014 at 3:08 PM  Between 7am to 6pm - Pager - [phone]  After 6pm go to [email or web address] - password [email or web address] [facility]  [facility]  Office  [phone]  CC: Primary care physician; No primary care provider on file.

## 2014-06-22 NOTE — Clinical Social Work Note (Signed)
CSW notified pt financial counseling about pt's and left VM asking them to f/u with pt for possible medicaid application.  CSW signing off

## 2014-06-22 NOTE — ED Provider Notes (Signed)
Assumed care from Dr. Dineen Kid CT scan of the chest revealed right apex 5 cm mass concerning for malignancy. In addition large 9 x 6 cm cystic mass in the left lung noted. Please refer to radiologist report for specific details. All findings were discussed with the patient and his wife. Patient will be admitted to the hospital for further evaluation for both of the masses as well as potential other etiologies namely cardiac for chest pain.  Gregor Hams, MD 06/22/14 [phone]

## 2014-06-22 NOTE — H&P (Signed)
[place] [facility] [place] NAME: Terry Freeman    MR#:  [id number]  DATE OF BIRTH:  [birth year]  DATE OF ADMISSION:  06/21/2014  PRIMARY CARE PHYSICIAN: No primary care provider on file.   REQUESTING/REFERRING PHYSICIAN: Dr. Owens Shark  CHIEF COMPLAINT:   Chief Complaint  Patient presents with  . Chest Pain    HISTORY OF PRESENT ILLNESS:  Terry Freeman  is a 59 y.o. male with below mentioned past medical history presents to the complaints of left-sided chest pain with radiation to the back. He took 3 aspirin tablets and EMS gave him nitroglycerin tablet following which his chest pain resolved completely. Denies any associated shortness of breath, diaphoresis, palpitations, nausea, vomiting, diarrhea, abdominal pain. Does have mild dizziness. Denies any focal weakness or numbness. Denies any fever, cough. Does admit that he has some night sweats for the past few weeks but denies any loss of weight or loss of appetite. Patient was evaluated by the ED physician and was noted to be with stable vital signs and afebrile and EKG with sinus tachycardia with ventricular rate of 10 1 bpm with no acute ischemic changes. Blood work revealed elevated white blood cell count of 16.1, creatinine 1.5, troponin 1 negative. Chest x-ray 8.5 cm left lung opacity. CT of the chest revealed large complex cyst in the left lateral mediastinum and scattered nodules in the right upper lobe concerning for malignancy. Patient is comfortably resting in the bed without any chest pain or shortness of breath and denies any new complaints. Hospitalist service was consulted for further management.  PAST MEDICAL HISTORY:   Past Medical History  Diagnosis Date  . COPD (chronic obstructive pulmonary disease)   . Hypertension   . Reflux   . Coronary artery disease   . Myocardial infarction   . Asthma   . Collagen vascular disease   . Chronic kidney disease     PAST SURGICAL  HISTORY:   Past Surgical History  Procedure Laterality Date  . Appendectomy    . Coronary angioplasty with stent placement      SOCIAL HISTORY:   History  Substance Use Topics  . Smoking status: Former Research scientist (life sciences)  . Smokeless tobacco: Not on file  . Alcohol Use: No    FAMILY HISTORY:   Family History  Problem Relation Age of Onset  . Cancer - Lung Mother   . CAD Mother   . Cancer - Lung Father     DRUG ALLERGIES:  No Known Allergies  REVIEW OF SYSTEMS:   Review of Systems  Constitutional: Negative for fever, chills and malaise/fatigue.       Night sweats + for the past few weeks.  HENT: Negative for ear pain, hearing loss, nosebleeds, sore throat and tinnitus.   Eyes: Negative for blurred vision, double vision, pain, discharge and redness.  Respiratory: Negative for cough, hemoptysis, sputum production, shortness of breath and wheezing.   Cardiovascular: Negative for chest pain, palpitations, orthopnea and leg swelling.       Episode of left-sided chest pain as noted in history of present illness, resolved with aspirin and nitroglycerin. Patient is chest pain-free now.  Gastrointestinal: Negative for nausea, vomiting, abdominal pain, diarrhea, constipation, blood in stool and melena.  Genitourinary: Negative for dysuria, urgency, frequency and hematuria.  Musculoskeletal: Negative for back pain, joint pain and neck pain.  Skin: Negative for itching and rash.  Neurological: Negative for dizziness, tingling, sensory change, focal weakness and seizures.  Endo/Heme/Allergies: Does  not bruise/bleed easily.  Psychiatric/Behavioral: Negative for depression. The patient is not nervous/anxious.     MEDICATIONS AT HOME:   Prior to Admission medications   Medication Sig Start Date End Date Taking? Authorizing Provider  clopidogrel (PLAVIX) 75 MG tablet Take 75 mg by mouth daily.   Yes Historical Provider, MD  metoprolol tartrate (LOPRESSOR) 25 MG tablet Take 12.5 mg by mouth  daily.   Yes Historical Provider, MD  ranitidine (ZANTAC) 150 MG tablet Take 150 mg by mouth 2 (two) times daily.   Yes Historical Provider, MD  simvastatin (ZOCOR) 40 MG tablet Take 40 mg by mouth daily.   Yes Historical Provider, MD      VITAL SIGNS:  Blood pressure 101/69, pulse 98, temperature 99.7 F (37.6 C), temperature source Oral, resp. rate 14, height 6' (1.829 m), weight 98.884 kg (218 lb), SpO2 94 %.  PHYSICAL EXAMINATION:  Physical Exam  Constitutional: He is oriented to person, place, and time. He appears well-developed and well-nourished. No distress.  HENT:  Head: Normocephalic and atraumatic.  Right Ear: External ear normal.  Left Ear: External ear normal.  Nose: Nose normal.  Mouth/Throat: Oropharynx is clear and moist. No oropharyngeal exudate.  Eyes: EOM are normal. Pupils are equal, round, and reactive to light. No scleral icterus.  Neck: Normal range of motion. Neck supple. No JVD present. No thyromegaly present.  Cardiovascular: Normal rate, regular rhythm, normal heart sounds and intact distal pulses.  Exam reveals no friction rub.   No murmur heard. Respiratory: Effort normal and breath sounds normal. No respiratory distress. He has no wheezes. He has no rales. He exhibits no tenderness.  GI: Soft. Bowel sounds are normal. He exhibits no distension and no mass. There is no tenderness. There is no rebound and no guarding.  Musculoskeletal: Normal range of motion. He exhibits no edema.  Lymphadenopathy:    He has no cervical adenopathy.  Neurological: He is alert and oriented to person, place, and time. He has normal reflexes. He displays normal reflexes. No cranial nerve deficit. He exhibits normal muscle tone.  Skin: Skin is warm. No rash noted. No erythema.  Psychiatric: He has a normal mood and affect. His behavior is normal. Thought content normal.   LABORATORY PANEL:   CBC  Recent Labs Lab 06/21/14 2151  WBC 16.1*  HGB 14.2  HCT 42.9  PLT 277    ------------------------------------------------------------------------------------------------------------------  Chemistries   Recent Labs Lab 06/21/14 2151  NA 136  K 3.8  CL 101  CO2 27  GLUCOSE 113*  BUN 17  CREATININE 1.50*  CALCIUM 9.0   ------------------------------------------------------------------------------------------------------------------  Cardiac Enzymes  Recent Labs Lab 06/22/14 0159  TROPONINI <0.03   ------------------------------------------------------------------------------------------------------------------  RADIOLOGY:  Dg Chest 2 View  06/21/2014   CLINICAL DATA:  Chest pain and shortness of breath.  EXAM: CHEST  2 VIEW  COMPARISON:  02/21/2010 chest radiograph  FINDINGS: An approximately 8.5 cm opacity overlying the medial anterior left lung is noted. Chest CT is recommended for further evaluation.  COPD/emphysema is identified.  Right apical opacity/scarring is noted.  A probable small effusion is noted.  There is no evidence of pneumothorax or acute bony abnormality.  IMPRESSION: 8.5 cm opacity overlying the medial anterior left lung- chest CT is recommended for further evaluation.  Question small pleural effusion.  COPD/ emphysema.   Electronically Signed   By: Margarette Canada M.D.   On: 06/21/2014 22:22   Ct Chest W Contrast  06/21/2014   CLINICAL DATA:  Acute  onset of intermittent chest pain and shortness of breath. Diaphoresis and left arm pain. Initial encounter.  EXAM: CT CHEST WITH CONTRAST  TECHNIQUE: Multidetector CT imaging of the chest was performed during intravenous contrast administration.  CONTRAST:  5m OMNIPAQUE IOHEXOL 350 MG/ML SOLN  COMPARISON:  Chest radiograph performed earlier today at 10:01 p.m.  FINDINGS: There is a large 9.2 x 8.2 x 6.5 cm complex cyst with multiple septations at the anterior left lateral aspect of the mediastinum. The differential for a cystic mass in the mediastinum includes a pericardial cyst, thymic  cyst, lymphangioma or possibly cystic teratoma. Its location is atypical for other etiologies. Malignancy is considered less likely, though it appears associated with scarring and pleural thickening near the left lung apex. Mild associated soft tissue edema is noted within the adjacent fat.  Diffuse emphysematous change is noted bilaterally, with large bullae seen at the lung apices. Focal opacification at the right lung apex measures 5.7 x 2.4 cm, with surrounding scarring, pleural thickening and multiple associated nodules measuring up to 9 mm in size. Given the scattered nodules within the right upper lobe, this is suspicious for primary malignancy. Biopsy and PET/CT would be helpful for further evaluation, as deemed clinically appropriate.  A trace left pleural effusion is noted. No additional pulmonary nodules are seen within the remaining lung lobes. No pneumothorax is identified.  Scattered coronary artery calcifications are seen. The mediastinum is otherwise grossly unremarkable. No mediastinal lymphadenopathy is seen. A 0.9 cm node at the aortopulmonary window remains within normal limits in size. The great vessels are unremarkable in appearance. The thyroid gland is unremarkable. No axillary lymphadenopathy is seen.  The visualized portions of the liver and spleen are unremarkable. Minimally increased attenuation dependently within the gallbladder could reflect small stones. The visualized portions of the gallbladder, pancreas, adrenal glands and left kidney are otherwise unremarkable.  IMPRESSION: 1. Focal opacification at the right lung apex measures 5.7 x 2.4 cm, with surrounding scarring, pleural thickening, and multiple associated nodules measuring up to 9 mm in size. Given the scattered nodules in the right upper lobe, this suspicious for primary malignancy. Biopsy and PET/CT would be helpful for further evaluation, as deemed clinically appropriate. 2. Large 9.2 x 8.2 x 6.5 cm complex cyst with  multiple septations at the anterior left lateral aspect of the mediastinum. This could reflect a pericardial cyst, thymic cyst, lymphangioma possibly cystic teratoma. Cystic lesions within the mediastinum tend not to be malignant, though there is some degree of associated scarring and pleural thickening near the left lung apex. Mild adjacent associated soft tissue edema noted. 3. Trace left pleural effusion noted, of uncertain significance. 4. Diffuse emphysematous change noted bilaterally, with large bullae at the lung apices. 5. Scattered coronary artery calcifications seen. 6. Question of mild cholelithiasis; visualized portions of the gallbladder are otherwise unremarkable.   Electronically Signed   By: JGarald BaldingM.D.   On: 06/21/2014 23:35    EKG:   Orders placed or performed during the hospital encounter of 06/21/14  . ED EKG sinus tachycardia with ventricular rate of 10 1 bpm, no acute ischemic changes.   . ED EKG    IMPRESSION AND PLAN:   1. Left-sided chest pain, relieved with aspirin and nitroglycerin. History of coronary artery disease status post stent. EKG no acute ischemic changes, troponin 1 negative. Patient chest pain-free now. Rule out ACS in view of prior cardiac history. 2. Abnormal CT chest with mediastinal cyst and multiple right lung nodules concerning  for possible malignancy. No pulmonary symptoms, no fever. Admits history of night sweats but no weight loss or appetite loss. Need further workup. 3. CAD status post stent. 4. Leukocytosis. Patient afebrile, no history of any fever or a less. ?? Cause. 5. Hypertension, stable on home medications. 6. COPD stable , no acute problems. 7. CK D, mild. History of elevated creatinine dating back to 2012. Stable. 8. Hyperlipidemia, stable on statin.   Plan: Admit to telemetry, continue aspirin, beta blocker, statin, Plavix. Cycle cardiac enzymes, order echocardiogram, cardiac consultation for further advice. Oncology  consultation for evaluation of possible lung mass, check sedimentation rate, blood cultures ordered, follow-up CBC and BMP Continue home medications for hypertension, COPD, hyperlipidemia.    All the records are reviewed and case discussed with ED provider. Management plans discussed with the patient, family and they are in agreement.  CODE STATUS: Full code  TOTAL TIME TAKING CARE OF THIS PATIENT: 50 minutes.    Azucena Freed N M.D on 06/22/2014 at 3:23 AM  Between 7am to 6pm - Pager - [phone]  After 6pm go to [email or web address] - password [email or web address] [facility]  [place] [facility]  Office  [phone]  CC: Primary care physician; No primary care provider on file.

## 2014-06-22 NOTE — Care Management Note (Addendum)
Case Management Note  Patient Details  Name: Terry Freeman MRN: [id number] Date of Birth: [birth year]  Subjective/Objective:           Self pay        Provided [name] with a generic drug discount card, an application to the [facility], the $4.00 [employer] medication list, and a list of local clinics where he could receive both medical care and medication assistance based on his income. [name] can see the two story tan building from his hospital room window where the [facility] is located, and verbalized understanding to take his prescriptions with him if he decides to go there for meds. Urged [name] to go to one of the local clinics on the list given to him to obtain a PCP, and to provide outpatient health care. [name] resides with his wife. Equipment at home = a front wheel rolling walker and an electric scooter. When asked about the electric scooter [name] reported that it belonged to his late father in law and that he had inherited it. He does not use it but has it available for future use if needed .    Expected Discharge Date:                  Expected Discharge Plan:     In-House Referral:     Discharge planning Services     Post Acute Care Choice:    Choice offered to:     DME Arranged:    DME Agency:     HH Arranged:    [place] Agency:     Status of Service:     Medicare Important Message Given:    Date Medicare IM Given:    Medicare IM give by:    Date Additional Medicare IM Given:    Additional Medicare Important Message give by:     If discussed at [place] of Stay Meetings, dates discussed:    Additional Comments:  Shiraz Bastyr A, RN 06/22/2014, 12:50 PM

## 2014-06-23 ENCOUNTER — Observation Stay: Payer: Self-pay

## 2014-06-23 LAB — CBC
HCT: 44.1 % (ref 40.0–52.0)
HEMOGLOBIN: 14.9 g/dL (ref 13.0–18.0)
MCH: 28.9 pg (ref 26.0–34.0)
MCHC: 33.8 g/dL (ref 32.0–36.0)
MCV: 85.4 fL (ref 80.0–100.0)
Platelets: 250 10*3/uL (ref 150–440)
RBC: 5.17 MIL/uL (ref 4.40–5.90)
RDW: 14.4 % (ref 11.5–14.5)
WBC: 10.5 10*3/uL (ref 3.8–10.6)

## 2014-06-23 LAB — GLUCOSE, CAPILLARY: Glucose-Capillary: 91 mg/dL (ref 65–99)

## 2014-06-23 MED ORDER — BUDESONIDE-FORMOTEROL FUMARATE 160-4.5 MCG/ACT IN AERO: 2.0000 | INHALATION_SPRAY | Freq: Two times a day (BID) | RESPIRATORY_TRACT | Status: DC

## 2014-06-23 MED ORDER — FLUDEOXYGLUCOSE F - 18 (FDG) INJECTION
12.3200 | Freq: Once | INTRAVENOUS | Status: AC | PRN
  Administered 2014-06-23: 12.32 via INTRAVENOUS

## 2014-06-23 MED ORDER — METOPROLOL TARTRATE 25 MG PO TABS: 12.5000 mg | ORAL_TABLET | Freq: Every day | ORAL | Status: DC

## 2014-06-23 MED ORDER — SIMVASTATIN 40 MG PO TABS: 40.0000 mg | ORAL_TABLET | Freq: Every day | ORAL | Status: DC

## 2014-06-23 MED ORDER — CLOPIDOGREL BISULFATE 75 MG PO TABS: 75.0000 mg | ORAL_TABLET | Freq: Every day | ORAL | Status: DC

## 2014-06-23 NOTE — Progress Notes (Signed)
Discharge instructions given to patient. IV and tele discontinued. Education given on chest pain. No new medications. PET scan results reviewed with patient by oncology and prime MD. Patient will follow up outpatient. No further questions.

## 2014-06-23 NOTE — Discharge Instructions (Signed)
°  DIET:  Low fat, Low cholesterol diet  DISCHARGE CONDITION:  Fair  ACTIVITY:  Activity as tolerated  OXYGEN:  Home Oxygen: No.   Oxygen Delivery: room air  DISCHARGE LOCATION:  home   If you experience worsening of your admission symptoms, develop shortness of breath, life threatening emergency, suicidal or homicidal thoughts you must seek medical attention immediately by calling 911 or calling your MD immediately  if symptoms less severe.  You Must read complete instructions/literature along with all the possible adverse reactions/side effects for all the Medicines you take and that have been prescribed to you. Take any new Medicines after you have completely understood and accpet all the possible adverse reactions/side effects.   Please note  You were cared for by a hospitalist during your hospital stay. If you have any questions about your discharge medications or the care you received while you were in the hospital after you are discharged, you can call the unit and asked to speak with the hospitalist on call if the hospitalist that took care of you is not available. Once you are discharged, your primary care physician will handle any further medical issues. Please note that NO REFILLS for any discharge medications will be authorized once you are discharged, as it is imperative that you return to your primary care physician (or establish a relationship with a primary care physician if you do not have one) for your aftercare needs so that they can reassess your need for medications and monitor your lab values.

## 2014-06-23 NOTE — Progress Notes (Signed)
all the records have been reviewed PET scan has been reviewed independently Case was discussed in tumor conference and discussed with Dr. Mortimer Fries, pulmonologist as well as Dr. Genevive Bi, thoracic surgeon We also pulled out record from the  [facility] and report indicates some of the abnormality which we have seen on present PET scan  Patient will be's followed outpatient by Dr. Faith Rogue   AND Dr. Mortimer Fries  They will opting for regional CT scan from 2014 from [facility] and compared with the recent CT scan and decide on further line of investigation. Present appointment will be made. Entire visit 30 minutes) spent informing patient all the results and discussing with all the doctors regarding further line of treatment

## 2014-06-27 LAB — CULTURE, BLOOD (ROUTINE X 2)
Culture: NO GROWTH
Culture: NO GROWTH

## 2014-06-27 NOTE — Discharge Summary (Signed)
[facility] [place]   PATIENT NAME: Terry Freeman    MR#:  [id number]  DATE OF BIRTH:  Jun 07, 1955  DATE OF ADMISSION:  06/21/2014 ADMITTING PHYSICIAN: Juluis Mire, MD  DATE OF DISCHARGE: 06/23/2014  5:00 PM  PRIMARY CARE PHYSICIAN: No primary care provider on file.    ADMISSION DIAGNOSIS:  Lung mass [R91.8] Chest pain, unspecified chest pain type [R07.9]  DISCHARGE DIAGNOSIS:  Principal Problem:   Chest pain Active Problems:   Abnormal CT scan, chest   CAD (coronary artery disease)   Leucocytosis   HTN (hypertension)   COPD (chronic obstructive pulmonary disease)   CKD (chronic kidney disease)   HLD (hyperlipidemia)   SECONDARY DIAGNOSIS:   Past Medical History  Diagnosis Date  . COPD (chronic obstructive pulmonary disease)   . Hypertension   . Reflux   . Coronary artery disease   . Myocardial infarction   . Asthma   . Collagen vascular disease   . Chronic kidney disease     HOSPITAL COURSE:   #1 chest pain: Admitted for evaluation due to multiple risk factors. Troponin negative 4. No EKG changes. No events on telemetry. Pain resolved on the day of discharge. Continues on metoprolol, Plavix, simvastatin.  #2 right sided chest mass: Right upper lobe opacification and left chest mediastinal cyst evaluated with PET scan. He was followed by oncology, Dr.Choksi during hospitalization. Who did not feel that the PET scan indicated an active malignancy. He will follow-up in the outpatient setting for further evaluation.  #3 hypertension blood pressure controlled with metoprolol. No changes made  #4 COPD: No exacerbation. Continue Symbicort  #5 CK D3: Stable. Creatinine 1.31 GFR 59  #6 hyperlipidemia: Continue statin   DISCHARGE CONDITIONS:   Stable  CONSULTS OBTAINED:  Treatment Team:  Lloyd Huger, MD Teodoro Spray, MD  DRUG ALLERGIES:  No Known Allergies  DISCHARGE MEDICATIONS:    Discharge Medication List as of 06/23/2014  4:10 PM    CONTINUE these medications which have CHANGED   Details  budesonide-formoterol (SYMBICORT) 160-4.5 MCG/ACT inhaler Inhale 2 puffs into the lungs 2 (two) times daily., Starting 06/23/2014, Until Discontinued, Print    clopidogrel (PLAVIX) 75 MG tablet Take 1 tablet (75 mg total) by mouth daily., Starting 06/23/2014, Until Discontinued, Print    metoprolol tartrate (LOPRESSOR) 25 MG tablet Take 0.5 tablets (12.5 mg total) by mouth daily., Starting 06/23/2014, Until Discontinued, Normal    simvastatin (ZOCOR) 40 MG tablet Take 1 tablet (40 mg total) by mouth daily., Starting 06/23/2014, Until Discontinued, Print      CONTINUE these medications which have NOT CHANGED   Details  aspirin 81 MG tablet Take 81 mg by mouth daily., Until Discontinued, Historical Med    ranitidine (ZANTAC) 150 MG tablet Take 150 mg by mouth 2 (two) times daily., Until Discontinued, Historical Med         DISCHARGE INSTRUCTIONS:     DIET:  Low fat, Low cholesterol diet  DISCHARGE CONDITION:  Fair  ACTIVITY:  Activity as tolerated  OXYGEN:  Home Oxygen: No.   Oxygen Delivery: room air  DISCHARGE LOCATION:  home    If you experience worsening of your admission symptoms, develop shortness of breath, life threatening emergency, suicidal or homicidal thoughts you must seek medical attention immediately by calling 911 or calling your MD immediately  if symptoms less severe.  You Must read complete instructions/literature along with all the possible adverse reactions/side effects for  all the Medicines you take and that have been prescribed to you. Take any new Medicines after you have completely understood and accept all the possible adverse reactions/side effects.   Please note  You were cared for by a hospitalist during your hospital stay. If you have any questions about your discharge medications or the care you received while you were in the  hospital after you are discharged, you can call the unit and asked to speak with the hospitalist on call if the hospitalist that took care of you is not available. Once you are discharged, your primary care physician will handle any further medical issues. Please note that NO REFILLS for any discharge medications will be authorized once you are discharged, as it is imperative that you return to your primary care physician (or establish a relationship with a primary care physician if you do not have one) for your aftercare needs so that they can reassess your need for medications and monitor your lab values.    Today   CHIEF COMPLAINT:   Chief Complaint  Patient presents with  . Chest Pain    HISTORY OF PRESENT ILLNESS:  Terry Freeman is a 59 y.o. male with below mentioned past medical history presents to the complaints of left-sided chest pain with radiation to the back. He took 3 aspirin tablets and EMS gave him nitroglycerin tablet following which his chest pain resolved completely. Denies any associated shortness of breath, diaphoresis, palpitations, nausea, vomiting, diarrhea, abdominal pain. Does have mild dizziness. Denies any focal weakness or numbness. Denies any fever, cough. Does admit that he has some night sweats for the past few weeks but denies any loss of weight or loss of appetite. Patient was evaluated by the ED physician and was noted to be with stable vital signs and afebrile and EKG with sinus tachycardia with ventricular rate of 10 1 bpm with no acute ischemic changes. Blood work revealed elevated white blood cell count of 16.1, creatinine 1.5, troponin 1 negative. Chest x-ray 8.5 cm left lung opacity. CT of the chest revealed large complex cyst in the left lateral mediastinum and scattered nodules in the right upper lobe concerning for malignancy. Patient is comfortably resting in the bed without any chest pain or shortness of breath and denies any new complaints. Hospitalist  service was consulted for further management.  VITAL SIGNS:  Blood pressure 113/69, pulse 78, temperature 97.7 F (36.5 C), temperature source Oral, resp. rate 19, height 6' (1.829 m), weight 96.48 kg (212 lb 11.2 oz), SpO2 94 %.  I/O:  No intake or output data in the 24 hours ending 06/27/14 1600  PHYSICAL EXAMINATION:  GENERAL: 59 y.o.-year-old patient lying in the bed with no acute distress.  EYES: Pupils equal, round, reactive to light and accommodation. No scleral icterus. Extraocular muscles intact.  HEENT: Head atraumatic, normocephalic. Oropharynx and nasopharynx clear.  NECK: Supple, no jugular venous distention. No thyroid enlargement, no tenderness.  LUNGS: Normal breath sounds bilaterally, no wheezing, rales,rhonchi or crepitation. No use of accessory muscles of respiration.  CARDIOVASCULAR: S1, S2 normal. No murmurs, rubs, or gallops.  ABDOMEN: Soft, nontender, nondistended. Bowel sounds present. No organomegaly or mass.  EXTREMITIES: No pedal edema, cyanosis, or clubbing.  NEUROLOGIC: Cranial nerves II through XII are intact. Muscle strength 5/5 in all extremities. Sensation intact. Gait not checked.  PSYCHIATRIC: The patient is alert and oriented x 3.  SKIN: No obvious rash, lesion, or ulcer.    DATA REVIEW:   CBC  Recent Labs  Lab 06/23/14 0447  WBC 10.5  HGB 14.9  HCT 44.1  PLT 250    Chemistries   Recent Labs Lab 06/22/14 0546  NA 137  K 3.6  CL 103  CO2 27  GLUCOSE 114*  BUN 17  CREATININE 1.31*  CALCIUM 8.7*  AST 28  ALT 30  ALKPHOS 63  BILITOT 1.3*    Cardiac Enzymes  Recent Labs Lab 06/22/14 1133  TROPONINI <0.03    Microbiology Results  Results for orders placed or performed during the hospital encounter of 06/21/14  Culture, blood (routine x 2)     Status: None   Collection Time: 06/22/14  5:46 AM  Result Value Ref Range Status   Specimen Description BLOOD  Final   Special Requests NONE  Final   Culture NO GROWTH  5 DAYS  Final   Report Status 06/27/2014 FINAL  Final  Culture, blood (routine x 2)     Status: None   Collection Time: 06/22/14  5:51 AM  Result Value Ref Range Status   Specimen Description BLOOD  Final   Special Requests NONE  Final   Culture NO GROWTH 5 DAYS  Final   Report Status 06/27/2014 FINAL  Final    RADIOLOGY:  No results found.  EKG:   Orders placed or performed during the hospital encounter of 06/21/14  . ED EKG  . ED EKG  . EKG 12-Lead  . EKG 12-Lead  . EKG      Management plans discussed with the patient, family and they are in agreement.  CODE STATUS: FULL  TOTAL TIME TAKING CARE OF THIS PATIENT: 40 minutes.    Myrtis Ser M.D on 06/27/2014 at 4:00 PM  Between 7am to 6pm - Pager - [phone]  After 6pm go to [email or web address] - password [email or web address] [facility]  [facility]  Office  [phone]  CC: Primary care physician; No primary care provider on file.

## 2014-08-08 ENCOUNTER — Other Ambulatory Visit: Payer: Self-pay | Admitting: *Deleted

## 2014-08-08 DIAGNOSIS — R9389 Abnormal findings on diagnostic imaging of other specified body structures: Secondary | ICD-10-CM

## 2014-08-08 NOTE — Progress Notes (Signed)
Patient's wife came to clinic concerned about "not knowing when the apt with Dr. Genevive Bi and Dr. Mortimer Fries will be scheduled." RN spoke with Dr. Oliva Bustard. Patient needs an appointment to see [facility].  An appointment was made for this Thursday at 130 pm to see both providers.  Called patient personally. He is aware about this appointment and agreeable to come on Thursday.

## 2014-08-11 ENCOUNTER — Inpatient Hospital Stay: Payer: MEDICAID | Admitting: Cardiothoracic Surgery

## 2014-08-11 ENCOUNTER — Encounter: Payer: Self-pay | Admitting: Cardiothoracic Surgery

## 2014-08-11 ENCOUNTER — Ambulatory Visit
Admission: RE | Admit: 2014-08-11 | Discharge: 2014-08-11 | Disposition: A | Discharge status: Home / Self Care | Payer: Self-pay | Source: Ambulatory Visit | Attending: Cardiothoracic Surgery | Admitting: Cardiothoracic Surgery

## 2014-08-11 ENCOUNTER — Other Ambulatory Visit: Payer: Self-pay | Admitting: Cardiothoracic Surgery

## 2014-08-11 ENCOUNTER — Inpatient Hospital Stay: Payer: Self-pay | Admitting: Internal Medicine

## 2014-08-11 ENCOUNTER — Inpatient Hospital Stay: Payer: Self-pay | Attending: Cardiothoracic Surgery | Admitting: Cardiothoracic Surgery

## 2014-08-11 VITALS — BP 142/88 | HR 66 | Temp 98.4°F | Wt 218.5 lb

## 2014-08-11 DIAGNOSIS — J984 Other disorders of lung: Secondary | ICD-10-CM

## 2014-08-11 DIAGNOSIS — Z7689 Persons encountering health services in other specified circumstances: Secondary | ICD-10-CM

## 2014-08-11 DIAGNOSIS — R918 Other nonspecific abnormal finding of lung field: Secondary | ICD-10-CM | POA: Insufficient documentation

## 2014-08-11 NOTE — Progress Notes (Signed)
Patient ID: Terry Freeman, male   DOB: 11-25-1955, 59 y.o.   MRN: [id number]  Chief Complaint  Patient presents with  . Follow-up    Here today to discuss abnormal PET scan results...    Referred By Dr. Dwaine Freeman Reason for Referral left upper lobe mass  HPI Location, Quality, Duration, Severity, Timing, Context, Modifying Factors, Associated Signs and Symptoms.  Terry Freeman is a 59 y.o. male.  I have personally seen and examined Terry Freeman. He is referred to me by Dr. Ramond Freeman he. Terry Freeman is a 59 year old white gentleman who presented to our emergency department approximately 6 weeks ago with some left-sided chest pain. He states that he awoke from sleep and had severe stabbing type pain in his left chest wall area. He describes this as being different than his prior myocardial infarction which he describes as more of a pressure sensation. There were no other associated symptoms at that time but he did present to our emergency department or a chest x-ray subsequent CT scan and PET scan were all performed. The pain lasted for several hours and then dissipated. He has had no pain since. It was not associated with nausea or vomiting. After several days in the hospital he was discharged and subsequent follow-up was made with Dr. Ramond Freeman he. The PET scan results suggested that this may represent a malignancy and he was subsequently referred to me for consideration of biopsy or surgery. The patient states that he's had prior CT scans that were performed in 2012 at [facility]. He had these performed in follow-up of his diagnosis of emphysema which was made in 2007 when he had a perforated appendix. In 2012 he had severe bolus emphysema in both of his upper lobes. He improved over the last couple years to the point where he had stopped all of his medications for his COPD. He also has improved clinically stating that he is now able to walk up heels and up stairs without any significant problems. He denies any  fevers or chills. He denies any cough or chest pain at this time. He denies any weight loss. He did smoke 4-5 packs of cigarettes per day for approximately 30 years and quit 10 years ago. During this most recent time frame he denied any specific smoking history.   Past Medical History  Diagnosis Date  . COPD (chronic obstructive pulmonary disease)   . Hypertension   . Reflux   . Coronary artery disease   . Myocardial infarction   . Asthma   . Collagen vascular disease   . Chronic kidney disease     Past Surgical History  Procedure Laterality Date  . Appendectomy    . Coronary angioplasty with stent placement      Family History  Problem Relation Age of Onset  . Cancer - Lung Mother   . CAD Mother   . Cancer - Lung Father     Social History History  Substance Use Topics  . Smoking status: Former Smoker -- 5.00 packs/day for 35 years  . Smokeless tobacco: Never Used  . Alcohol Use: No    No Known Allergies  Current Outpatient Prescriptions  Medication Sig Dispense Refill  . aspirin 81 MG tablet Take 81 mg by mouth daily.    . clopidogrel (PLAVIX) 75 MG tablet Take 1 tablet (75 mg total) by mouth daily. 30 tablet 3  . metoprolol tartrate (LOPRESSOR) 25 MG tablet Take 0.5 tablets (12.5 mg total) by mouth daily. 30 tablet 3  .  mometasone-formoterol (DULERA) 100-5 MCG/ACT AERO Inhale 2 puffs into the lungs 2 (two) times daily.    . ranitidine (ZANTAC) 150 MG tablet Take 150 mg by mouth 2 (two) times daily.    . simvastatin (ZOCOR) 40 MG tablet Take 1 tablet (40 mg total) by mouth daily. 30 tablet 3  . budesonide-formoterol (SYMBICORT) 160-4.5 MCG/ACT inhaler Inhale 2 puffs into the lungs 2 (two) times daily. (Patient not taking: Reported on 08/11/2014) 1 Inhaler 12   No current facility-administered medications for this visit.      Review of Systems A complete review of systems was asked and was negative except for the following positive findings a complete review of  systems was asked and was negative. Specifically he denied any fevers chills cough hemoptysis or weight loss  Blood pressure 142/88, pulse 66, temperature 98.4 F (36.9 C), temperature source Tympanic, weight 218 lb 7.6 oz (99.1 kg), SpO2 100 %.  Physical Exam CONSTITUTIONAL:  Pleasant, well-developed, well-nourished, and in no acute distress. EYES: Pupils equal and reactive to light, Sclera non-icteric EARS, NOSE, MOUTH AND THROAT:  The oropharynx was clear.  Dentition is good repair.  Oral mucosa pink and moist. LYMPH NODES:  Lymph nodes in the neck and axillae were normal RESPIRATORY:  Lungs were clear.  Normal respiratory effort without pathologic use of accessory muscles of respiration CARDIOVASCULAR: Heart was regular without murmurs.  There were no carotid bruits. GI: The abdomen was soft, nontender, and nondistended. There were no palpable masses. There was no hepatosplenomegaly. There were normal bowel sounds in all quadrants. GU:  Rectal deferred.   MUSCULOSKELETAL:  Normal muscle strength and tone.  No clubbing or cyanosis.   SKIN:  There were no pathologic skin lesions.  There were no nodules on palpation. NEUROLOGIC:  Sensation is normal.  Cranial nerves are grossly intact. PSYCH:  Oriented to person, place and time.  Mood and affect are normal.  Data Reviewed I have personally reviewed the PET scan and prior CT scans.  I have personally reviewed the patient's imaging, laboratory findings and medical records.    Assessment    I have reviewed the PET scan and CT scans. I believe that the lesion in question is likely to have fluid within an infected bulla. The lesions in the right upper lobe are again consistent with fibrosis and consolidation and are not very worrisome for a malignancy.    Plan    I believe that the left upper lobe area can be biopsied or aspirated using CT guidance. I've asked Terry Freeman to meet with Dr. Towanda Malkin who is involved in his care several  years ago when he had a stent placed. The patient is on aspirin and Plavix which will need to be addressed. I've also asked the patient to follow-up with Dr. Louie Bun for continued management of his underlying COPD. Once the aspiration and/or biopsy is performed of the left upper lobe area I will see the patient back and further follow-up.       Nestor Lewandowsky, MD 08/11/2014, 10:03 AM

## 2014-08-12 ENCOUNTER — Telehealth: Payer: Self-pay | Admitting: *Deleted

## 2014-08-12 NOTE — Telephone Encounter (Signed)
  Oncology Nurse Navigator Documentation    Navigator Encounter Type: Telephone (08/12/14 1400)               Obtained and notified patient of cardiology appt for cardiac clearance/ holding asa and plavix for CT guided biopsy of lung mass. Appt is Tuesday 08/16/14 at 9:15am with Dr. Clayborn Bigness (per patient request). Patient verbalizes understanding.

## 2014-08-18 ENCOUNTER — Other Ambulatory Visit: Payer: Self-pay | Admitting: Cardiothoracic Surgery

## 2014-08-18 ENCOUNTER — Encounter: Payer: Self-pay | Admitting: Internal Medicine

## 2014-08-18 ENCOUNTER — Inpatient Hospital Stay: Payer: Self-pay | Admitting: Internal Medicine

## 2014-08-18 VITALS — BP 135/88 | HR 83 | Temp 97.7°F | Ht 75.0 in | Wt 218.1 lb

## 2014-08-18 DIAGNOSIS — C3412 Malignant neoplasm of upper lobe, left bronchus or lung: Secondary | ICD-10-CM

## 2014-08-18 DIAGNOSIS — J439 Emphysema, unspecified: Secondary | ICD-10-CM

## 2014-08-18 DIAGNOSIS — R918 Other nonspecific abnormal finding of lung field: Secondary | ICD-10-CM | POA: Insufficient documentation

## 2014-08-18 NOTE — Patient Instructions (Signed)
Follow up in 6 months Percutaneous biopsy pending

## 2014-08-18 NOTE — Assessment & Plan Note (Addendum)
-  PFT/s from 2012 at [facility] shows FEv1 48%-moderate Obstructive Disease -no indication for  Pulmonary Function tests at this time -patient with stable COPD and very active -will not recommend therapy at this time -patient REFUSES Pneumovax shot

## 2014-08-18 NOTE — Progress Notes (Signed)
Date: 08/18/2014,   MRN# [id number] Terry Freeman 07-Dec-1955 Code Status:  Hosp day:'@LENGTHOFSTAYDAYS'$ @ Referring MD: '@ATDPROV'$ @     PCP:      AdmissionWeight: 218 lb 2.3 oz (98.95 kg)                 CurrentWeight: 218 lb 2.3 oz (98.95 kg) Terry Freeman is a 59 y.o. old male seen in consultation for COPD and lung mass.    CHIEF COMPLAINT:   abnormal CT scan with bullous emphysema and Lung mass   HISTORY OF PRESENT ILLNESS   60 yo white male seen today for above chief complaint, patient with significant smoking history and had been seen [facility] for severe bullous emphyesema and assessment for lung volume reduction surgery  Patient has been on on inhaled steroids severeal years ago. Patient has stopped takinmg all inhaled medictaions and states that he is very active and does NOT feel SOB or has increased WOB. Patient is asymptomatic.  Ct chest reveals large cystic mediastinal mass that is pos on PET scan. Patient has no symptoms from this mass at this time   PAST MEDICAL HISTORY   Past Medical History  Diagnosis Date  . COPD (chronic obstructive pulmonary disease)   . Hypertension   . Reflux   . Coronary artery disease   . Myocardial infarction   . Asthma   . Collagen vascular disease   . Chronic kidney disease      SURGICAL HISTORY   Past Surgical History  Procedure Laterality Date  . Appendectomy    . Coronary angioplasty with stent placement       FAMILY HISTORY   Family History  Problem Relation Age of Onset  . Cancer - Lung Mother   . CAD Mother   . Cancer - Lung Father      SOCIAL HISTORY   History  Substance Use Topics  . Smoking status: Former Smoker -- 5.00 packs/day for 35 years  . Smokeless tobacco: Never Used  . Alcohol Use: No     MEDICATIONS    Home Medication:  Current Outpatient Rx  Name  Route  Sig  Dispense  Refill  . aspirin 81 MG tablet   Oral   Take 81 mg by mouth daily.         . budesonide-formoterol (SYMBICORT)  160-4.5 MCG/ACT inhaler   Inhalation   Inhale 2 puffs into the lungs 2 (two) times daily. Patient not taking: Reported on 08/11/2014   1 Inhaler   12   . clopidogrel (PLAVIX) 75 MG tablet   Oral   Take 1 tablet (75 mg total) by mouth daily.   30 tablet   3   . metoprolol tartrate (LOPRESSOR) 25 MG tablet   Oral   Take 0.5 tablets (12.5 mg total) by mouth daily.   30 tablet   3   . mometasone-formoterol (DULERA) 100-5 MCG/ACT AERO   Inhalation   Inhale 2 puffs into the lungs 2 (two) times daily.         . ranitidine (ZANTAC) 150 MG tablet   Oral   Take 150 mg by mouth 2 (two) times daily.         . simvastatin (ZOCOR) 40 MG tablet   Oral   Take 1 tablet (40 mg total) by mouth daily.   30 tablet   3     Current Medication:  Current outpatient prescriptions:  .  aspirin 81 MG tablet, Take 81 mg by mouth daily., Disp: ,  Rfl:  .  budesonide-formoterol (SYMBICORT) 160-4.5 MCG/ACT inhaler, Inhale 2 puffs into the lungs 2 (two) times daily. (Patient not taking: Reported on 08/11/2014), Disp: 1 Inhaler, Rfl: 12 .  clopidogrel (PLAVIX) 75 MG tablet, Take 1 tablet (75 mg total) by mouth daily., Disp: 30 tablet, Rfl: 3 .  metoprolol tartrate (LOPRESSOR) 25 MG tablet, Take 0.5 tablets (12.5 mg total) by mouth daily., Disp: 30 tablet, Rfl: 3 .  mometasone-formoterol (DULERA) 100-5 MCG/ACT AERO, Inhale 2 puffs into the lungs 2 (two) times daily., Disp: , Rfl:  .  ranitidine (ZANTAC) 150 MG tablet, Take 150 mg by mouth 2 (two) times daily., Disp: , Rfl:  .  simvastatin (ZOCOR) 40 MG tablet, Take 1 tablet (40 mg total) by mouth daily., Disp: 30 tablet, Rfl: 3    ALLERGIES   Review of patient's allergies indicates no known allergies.     REVIEW OF SYSTEMS   Review of Systems  Constitutional: Negative for fever, chills, weight loss, malaise/fatigue and diaphoresis.  HENT: Negative for congestion and hearing loss.   Eyes: Negative for blurred vision and double vision.   Respiratory: Negative for cough, hemoptysis, sputum production, shortness of breath and wheezing.   Cardiovascular: Negative for chest pain, palpitations and orthopnea.  Gastrointestinal: Negative for heartburn, nausea, vomiting, abdominal pain, diarrhea, constipation and blood in stool.  Genitourinary: Negative for dysuria and urgency.  Musculoskeletal: Negative for myalgias, back pain and neck pain.  Skin: Negative for rash.  Neurological: Negative for dizziness, tingling, tremors, weakness and headaches.  Endo/Heme/Allergies: Does not bruise/bleed easily.  Psychiatric/Behavioral: Negative for depression, suicidal ideas and substance abuse.  All other systems reviewed and are negative.    VS: BP 135/88 mmHg  Pulse 83  Temp(Src) 97.7 F (36.5 C) (Tympanic)  Ht '6\' 3"'$  (1.905 m)  Wt 218 lb 2.3 oz (98.95 kg)  BMI 27.27 kg/m2  SpO2 99%     PHYSICAL EXAM  Physical Exam  Constitutional: He is oriented to person, place, and time. He appears well-developed and well-nourished. No distress.  HENT:  Head: Normocephalic and atraumatic.  Mouth/Throat: No oropharyngeal exudate.  Eyes: EOM are normal. Pupils are equal, round, and reactive to light. No scleral icterus.  Neck: Normal range of motion. Neck supple.  Cardiovascular: Normal rate, regular rhythm and normal heart sounds.   No murmur heard. Pulmonary/Chest: No stridor. No respiratory distress. He has no wheezes.  Abdominal: Soft. Bowel sounds are normal. He exhibits no distension. There is no tenderness. There is no rebound.  Musculoskeletal: Normal range of motion. He exhibits no edema.  Neurological: He is alert and oriented to person, place, and time. He displays normal reflexes. Coordination normal.  Skin: Skin is warm. He is not diaphoretic.  Psychiatric: He has a normal mood and affect.        LABS    No results for input(s): HGB, HCT, MCV, WBC, POTASSIUM, CHLORIDE, BUN, CREATININE, GLUCOSE, CALCIUM, INR, PTT in the  last 72 hours.  Invalid input(s): PLATELET, BANDS, NEUTROPHIL, LYMPHOCYTE, MONOCYTE, EOSINOPHILS, BASOPHIL, SODIUM, BICARBONATE, MAGNESIUM, PHOSPHORUS, PT, SGPT, SGOT,    No results for input(s): PH in the last 72 hours.  Invalid input(s): PCO2, PO2, BASEEXCESS, BASEDEFICITE, TFT    CULTURE RESULTS   No results found for this or any previous visit (from the past 240 hour(s)).        IMAGING    No results found.         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  COPD (chronic obstructive pulmonary disease) -PFT/s from 2012 at [facility] shows FEv1 48%-moderate Obstructive Disease -no indication for  Pulmonary Function tests at this time -patient with stable COPD and very active -will not recommend therapy at this time -patient REFUSES Pneumovax shot  Lung mass -large cystic lung mass -plan for percutaneous Ct guided biopsy with Radiology to be scheduled -cardiology clearance to assess stopping ASA and Plavix completed   Patient satisfied with plan of action and management   I have personally obtained a history, examined the patient, evaluated laboratory and independently reviewed imaging results, formulated the assessment and plan and placed orders.  The Patient requires high complexity decision making for assessment and support, frequent evaluation and titration of therapies, application of advanced monitoring technologies and extensive interpretation of multiple databases. Time spent with patient 45 minutes.  Patient is satisfied with Plan of action and management.    Corrin Parker, M.D.  [facility]  Medical Director [place] Director [facility] Cardio-Pulmonary Department

## 2014-08-18 NOTE — Progress Notes (Signed)
   Subjective:    Patient ID: Terry Freeman, male    DOB: Mar 09, 1955, 59 y.o.   MRN: [id number]  HPI    Review of Systems     Objective:   Physical Exam        Assessment & Plan:

## 2014-08-18 NOTE — Assessment & Plan Note (Signed)
-  large cystic lung mass -plan for percutaneous Ct guided biopsy with Radiology to be scheduled -cardiology clearance to assess stopping ASA and Plavix completed

## 2014-08-19 ENCOUNTER — Other Ambulatory Visit: Payer: Self-pay | Admitting: *Deleted

## 2014-08-19 ENCOUNTER — Encounter: Payer: Self-pay | Admitting: *Deleted

## 2014-08-19 DIAGNOSIS — R918 Other nonspecific abnormal finding of lung field: Secondary | ICD-10-CM

## 2014-08-19 NOTE — Progress Notes (Signed)
  Oncology Nurse Navigator Documentation    Navigator Encounter Type: Clinic/MDC (08/19/14 0600)               Met with patient and wife at pulmonary follow up appt. Will follow with plan for CT guided biopsy and 6 month followup appt with Dr. Mortimer Fries.

## 2014-08-23 ENCOUNTER — Other Ambulatory Visit: Payer: Self-pay | Admitting: Radiology

## 2014-08-24 ENCOUNTER — Other Ambulatory Visit: Payer: Self-pay | Admitting: Cardiothoracic Surgery

## 2014-08-24 ENCOUNTER — Other Ambulatory Visit: Payer: Self-pay | Admitting: Physician Assistant

## 2014-08-24 ENCOUNTER — Ambulatory Visit
Admission: RE | Admit: 2014-08-24 | Discharge: 2014-08-24 | Disposition: A | Discharge status: Home / Self Care | Payer: Self-pay | Source: Ambulatory Visit | Attending: Interventional Radiology | Admitting: Interventional Radiology

## 2014-08-24 ENCOUNTER — Ambulatory Visit
Admission: RE | Admit: 2014-08-24 | Discharge: 2014-08-24 | Disposition: A | Discharge status: Home / Self Care | Payer: Self-pay | Source: Ambulatory Visit | Attending: Cardiothoracic Surgery | Admitting: Cardiothoracic Surgery

## 2014-08-24 DIAGNOSIS — Z801 Family history of malignant neoplasm of trachea, bronchus and lung: Secondary | ICD-10-CM | POA: Insufficient documentation

## 2014-08-24 DIAGNOSIS — I2510 Atherosclerotic heart disease of native coronary artery without angina pectoris: Secondary | ICD-10-CM | POA: Insufficient documentation

## 2014-08-24 DIAGNOSIS — J189 Pneumonia, unspecified organism: Secondary | ICD-10-CM | POA: Insufficient documentation

## 2014-08-24 DIAGNOSIS — R079 Chest pain, unspecified: Secondary | ICD-10-CM | POA: Insufficient documentation

## 2014-08-24 DIAGNOSIS — C3412 Malignant neoplasm of upper lobe, left bronchus or lung: Secondary | ICD-10-CM

## 2014-08-24 DIAGNOSIS — R911 Solitary pulmonary nodule: Secondary | ICD-10-CM | POA: Insufficient documentation

## 2014-08-24 DIAGNOSIS — I10 Essential (primary) hypertension: Secondary | ICD-10-CM | POA: Insufficient documentation

## 2014-08-24 DIAGNOSIS — Z9889 Other specified postprocedural states: Secondary | ICD-10-CM | POA: Insufficient documentation

## 2014-08-24 DIAGNOSIS — E785 Hyperlipidemia, unspecified: Secondary | ICD-10-CM | POA: Insufficient documentation

## 2014-08-24 DIAGNOSIS — N189 Chronic kidney disease, unspecified: Secondary | ICD-10-CM | POA: Insufficient documentation

## 2014-08-24 DIAGNOSIS — J449 Chronic obstructive pulmonary disease, unspecified: Secondary | ICD-10-CM | POA: Insufficient documentation

## 2014-08-24 DIAGNOSIS — D72829 Elevated white blood cell count, unspecified: Secondary | ICD-10-CM | POA: Insufficient documentation

## 2014-08-24 HISTORY — DX: Pneumonia, unspecified organism: J18.9

## 2014-08-24 HISTORY — DX: Gastro-esophageal reflux disease without esophagitis: K21.9

## 2014-08-24 LAB — CBC
HEMATOCRIT: 43.5 % (ref 40.0–52.0)
Hemoglobin: 14.1 g/dL (ref 13.0–18.0)
MCH: 27 pg (ref 26.0–34.0)
MCHC: 32.4 g/dL (ref 32.0–36.0)
MCV: 83.5 fL (ref 80.0–100.0)
Platelets: 333 10*3/uL (ref 150–440)
RBC: 5.22 MIL/uL (ref 4.40–5.90)
RDW: 14 % (ref 11.5–14.5)
WBC: 11.8 10*3/uL — ABNORMAL HIGH (ref 3.8–10.6)

## 2014-08-24 LAB — APTT: aPTT: 29 seconds (ref 24–36)

## 2014-08-24 LAB — PROTIME-INR
INR: 1
Prothrombin Time: 13.4 seconds (ref 11.4–15.0)

## 2014-08-24 MED ORDER — FENTANYL CITRATE (PF) 100 MCG/2ML IJ SOLN
INTRAMUSCULAR | Status: AC | PRN
  Administered 2014-08-24: 25 ug via INTRAVENOUS
  Administered 2014-08-24: 50 ug via INTRAVENOUS

## 2014-08-24 MED ORDER — SODIUM CHLORIDE 0.9 % IV SOLN
INTRAVENOUS | Status: DC
  Administered 2014-08-24: 10:00:00 via INTRAVENOUS

## 2014-08-24 MED ORDER — MIDAZOLAM HCL 5 MG/5ML IJ SOLN
INTRAMUSCULAR | Status: AC | PRN
  Administered 2014-08-24 (×2): 1 mg via INTRAVENOUS
  Administered 2014-08-24: 0.5 mg via INTRAVENOUS

## 2014-08-24 NOTE — Procedures (Signed)
Technically successful CT guided aspiration of cystic mass within the anterior mediastinum/medial aspect of the left upper lobe yielding 80 cc of purulent appearing material. All samples sent to lab for analysis.  No immediate post procedural complications.   Ronny Bacon, MD Pager #: [phone]

## 2014-08-24 NOTE — Consult Note (Signed)
Chief Complaint: Patient was seen in consultation today for CT guided left lung mass aspiration/biopsy at the request of Oaks,Timothy  Referring Physician(s): Oaks,Timothy  History of Present Illness: Terry Freeman is a 59 y.o. male with past medical history significant for CAD/myocardial infarction, collagen vascular disease and COPD who was found to have a predominantly cystic mass within the anterior mediastinum/medial aspect of the left upper lobe. The peripheral aspect of this mass demonstrated metabolic activity on subsequent PET scan performed 06/23/2014 with broad differential considerations including pericardial cyst, thymic cyst, lymphangioma, cystic teratoma as well as the sequela of prior infected and obstructed bulla related to background of advanced emphysematous change. Patient referred today for CT-guided mass aspiration/biopsy. The patient is accompanied by his wife though serves as own historian.   Since last being seen by Dr. Faith Rogue, the patient reports no interval change in his health. No fever or chills. No cough or Um op this cyst.   The patient's stopped his Plavix and aspirin greater than 1 week ago.    Past Medical History  Diagnosis Date  . COPD (chronic obstructive pulmonary disease)   . Hypertension   . Reflux   . Coronary artery disease   . Myocardial infarction   . Asthma   . Collagen vascular disease   . Chronic kidney disease   . Pneumonia 1983  . GERD (gastroesophageal reflux disease)     Past Surgical History  Procedure Laterality Date  . Appendectomy    . Coronary angioplasty with stent placement      Allergies: Review of patient's allergies indicates no known allergies.  Medications: Prior to Admission medications   Medication Sig Start Date End Date Taking? Authorizing Provider  budesonide-formoterol (SYMBICORT) 160-4.5 MCG/ACT inhaler Inhale 2 puffs into the lungs 2 (two) times daily. 06/23/14  Yes Aldean Jewett, MD  metoprolol  tartrate (LOPRESSOR) 25 MG tablet Take 0.5 tablets (12.5 mg total) by mouth daily. 06/23/14  Yes Aldean Jewett, MD  ranitidine (ZANTAC) 150 MG tablet Take 150 mg by mouth 2 (two) times daily.   Yes Historical Provider, MD  simvastatin (ZOCOR) 40 MG tablet Take 1 tablet (40 mg total) by mouth daily. 06/23/14  Yes Aldean Jewett, MD  aspirin 81 MG tablet Take 81 mg by mouth daily.    Historical Provider, MD  clopidogrel (PLAVIX) 75 MG tablet Take 1 tablet (75 mg total) by mouth daily. 06/23/14   Aldean Jewett, MD  mometasone-formoterol (DULERA) 100-5 MCG/ACT AERO Inhale 2 puffs into the lungs 2 (two) times daily.    Historical Provider, MD     Family History  Problem Relation Age of Onset  . Cancer - Lung Mother   . CAD Mother   . Cancer - Lung Father     History   Social History  . Marital Status: Married    Spouse Name: N/A  . Number of Children: N/A  . Years of Education: N/A   Social History Main Topics  . Smoking status: Former Smoker -- 5.00 packs/day for 35 years    Quit date: 08/23/2004  . Smokeless tobacco: Never Used  . Alcohol Use: No  . Drug Use: No     Comment: Quit smoking 10 years ago...  . Sexual Activity: Not on file   Other Topics Concern  . None   Social History Narrative    ECOG Status: 0 - Asymptomatic  Review of Systems: A 12 point ROS discussed and pertinent positives are indicated  in the HPI above.  All other systems are negative.  Review of Systems  Constitutional: Negative.   Respiratory: Negative for cough and chest tightness.   Cardiovascular: Negative for chest pain.    Vital Signs: BP 127/90 mmHg  Pulse 82  Temp(Src) 98.5 F (36.9 C) (Oral)  Resp 18  Ht '6\' 3"'$  (1.905 m)  Wt 219 lb (99.338 kg)  BMI 27.37 kg/m2  SpO2 98%  Physical Exam  Constitutional: He appears well-developed and well-nourished.  Cardiovascular: Normal rate and regular rhythm.   Pulmonary/Chest: Effort normal and breath sounds normal.  Nursing note and  vitals reviewed.   Mallampati Score:     Imaging: No results found.  Labs:  CBC:  Recent Labs  06/21/14 2151 06/22/14 0546 06/23/14 0447 08/24/14 0835  WBC 16.1* 13.5* 10.5 11.8*  HGB 14.2 14.4 14.9 14.1  HCT 42.9 41.5 44.1 43.5  PLT 277 246 250 333    COAGS: No results for input(s): INR, APTT in the last 8760 hours.  BMP:  Recent Labs  06/21/14 2151 06/22/14 0546  NA 136 137  K 3.8 3.6  CL 101 103  CO2 27 27  GLUCOSE 113* 114*  BUN 17 17  CALCIUM 9.0 8.7*  CREATININE 1.50* 1.31*  GFRNONAA 50* 59*  GFRAA 58* >60    LIVER FUNCTION TESTS:  Recent Labs  06/22/14 0546  BILITOT 1.3*  AST 28  ALT 30  ALKPHOS 63  PROT 7.1  ALBUMIN 3.7    TUMOR MARKERS: No results for input(s): AFPTM, CEA, CA199, CHROMGRNA in the last 8760 hours.  Assessment and Plan:  This is a 59 y/o M with past medical history significant for CAD/myocardial infarction, collagen vascular disease and COPD who was found to have a predominantly cystic mass within the anterior mediastinum/medial aspect of the left upper lobe. The peripheral aspect of this mass demonstrated metabolic activity on subsequent PET scan performed 06/23/2014 with broad differential considerations including pericardial cyst, thymic cyst, lymphangioma, cystic teratoma as well as the sequela of prior infected and obstructed bulla related to background of advanced emphysematous change. Patient referred today for CT-guided mass aspiration/biopsy.   The patient's stopped his Plavix and aspirin greater than 1 week ago.   Risks and Benefits discussed with the patient including, but not limited to bleeding, hemoptysis, respiratory failure requiring intubation, infection, pneumothorax requiring chest tube placement, stroke from air embolism or even death. All of the patient's questions were answered, patient is agreeable to proceed. Consent signed and in chart.  SignedSandi Mariscal 08/24/2014, 9:14  AM

## 2014-08-25 LAB — CYTOLOGY - NON PAP

## 2014-08-28 LAB — CULTURE, ROUTINE-ABSCESS: Special Requests: NORMAL

## 2014-08-29 ENCOUNTER — Telehealth: Payer: Self-pay

## 2014-08-29 NOTE — Telephone Encounter (Signed)
  Oncology Nurse Navigator Documentation    Navigator Encounter Type: Telephone (08/29/14 1500)               Consulted with Dr Genevive Bi and Dr Oliva Bustard after receiving call from spouse  Santiago Glad asking about biopsy results. Biopsy is negative for malignancy but Dr Oliva Bustard does want to see pt regarding the infection tht grew from cultures. Appt arranged for 09/01/14. Notified of biopsy report and appt

## 2014-09-01 ENCOUNTER — Inpatient Hospital Stay: Payer: Self-pay | Attending: Oncology | Admitting: Oncology

## 2014-09-01 VITALS — BP 146/90 | HR 66 | Temp 97.6°F | Wt 218.5 lb

## 2014-09-01 DIAGNOSIS — I2510 Atherosclerotic heart disease of native coronary artery without angina pectoris: Secondary | ICD-10-CM | POA: Insufficient documentation

## 2014-09-01 DIAGNOSIS — K219 Gastro-esophageal reflux disease without esophagitis: Secondary | ICD-10-CM | POA: Insufficient documentation

## 2014-09-01 DIAGNOSIS — Z7982 Long term (current) use of aspirin: Secondary | ICD-10-CM | POA: Insufficient documentation

## 2014-09-01 DIAGNOSIS — R079 Chest pain, unspecified: Secondary | ICD-10-CM | POA: Insufficient documentation

## 2014-09-01 DIAGNOSIS — Z79899 Other long term (current) drug therapy: Secondary | ICD-10-CM | POA: Insufficient documentation

## 2014-09-01 DIAGNOSIS — J45909 Unspecified asthma, uncomplicated: Secondary | ICD-10-CM | POA: Insufficient documentation

## 2014-09-01 DIAGNOSIS — I998 Other disorder of circulatory system: Secondary | ICD-10-CM | POA: Insufficient documentation

## 2014-09-01 DIAGNOSIS — R918 Other nonspecific abnormal finding of lung field: Secondary | ICD-10-CM | POA: Insufficient documentation

## 2014-09-01 DIAGNOSIS — N189 Chronic kidney disease, unspecified: Secondary | ICD-10-CM | POA: Insufficient documentation

## 2014-09-01 DIAGNOSIS — Z8701 Personal history of pneumonia (recurrent): Secondary | ICD-10-CM | POA: Insufficient documentation

## 2014-09-01 DIAGNOSIS — I129 Hypertensive chronic kidney disease with stage 1 through stage 4 chronic kidney disease, or unspecified chronic kidney disease: Secondary | ICD-10-CM | POA: Insufficient documentation

## 2014-09-01 DIAGNOSIS — I252 Old myocardial infarction: Secondary | ICD-10-CM | POA: Insufficient documentation

## 2014-09-01 DIAGNOSIS — Z87891 Personal history of nicotine dependence: Secondary | ICD-10-CM | POA: Insufficient documentation

## 2014-09-01 DIAGNOSIS — J449 Chronic obstructive pulmonary disease, unspecified: Secondary | ICD-10-CM | POA: Insufficient documentation

## 2014-09-01 DIAGNOSIS — Z801 Family history of malignant neoplasm of trachea, bronchus and lung: Secondary | ICD-10-CM | POA: Insufficient documentation

## 2014-09-01 MED ORDER — AMOXICILLIN-POT CLAVULANATE 875-125 MG PO TABS: 1.0000 | ORAL_TABLET | Freq: Two times a day (BID) | ORAL | Status: DC

## 2014-09-01 NOTE — Progress Notes (Signed)
Patient does not have living will.  Former smoker. 

## 2014-10-16 ENCOUNTER — Encounter: Payer: Self-pay | Admitting: Oncology

## 2014-10-16 NOTE — Progress Notes (Signed)
[place] @ [facility] Telephone:(336) [phone]  Fax:(336) [place] OB: 01-06-1956  MR#: [id number]  GBT#:[id number]  Patient Care Team: No Pcp Per Patient as PCP - General (General Practice) Teodoro Spray, MD as Consulting Physician (Cardiology)  CHIEF COMPLAINT:  Chief Complaint  Patient presents with  . Follow-up   INTERVAL HISTORY: 59 year old gentleman who developed acute chest pain and left upper lobe chest pain along with dizziness. Patient was brought to emergency room where CT scan of the chest was abnormal. Patient was admitted in the hospital. Pain has resolved. Patient has a previous history of cardiac disease and stent placement in 2014. Patient has smoked in the past for 20 years but has not smoked for last several years now. No significant weight loss. No hemoptysis  Patient underwent aspiration off left upper lobe complex cyst   And  was acute inflammation negative for malignant cells. Case was discussed in tumor conference.   Patient does not have any chest pain.  No chills.  No fever.     REVIEW OF SYSTEMS:    general status: Patient is feeling   stronger.  No change in a performance status.  No chills.  No fever. HEENT: Alopecia.  No evidence of stomatitis Lungs: No cough or shortness of breath Cardiac: No chest pain or paroxysmal nocturnal dyspnea GI: No nausea no vomiting no diarrhea no abdominal pain Skin: No rash Lower extremity no swelling Neurological system: No tingling.  No numbness.  No other focal signs Musculoskeletal system no bony pains  As per HPI. Otherwise, a complete review of systems is negatve.  PAST MEDICAL HISTORY: Past Medical History  Diagnosis Date  . COPD (chronic obstructive pulmonary disease)   . Hypertension   . Reflux   . Coronary artery disease   . Myocardial infarction   . Asthma   . Collagen vascular disease   . Chronic kidney disease   . Pneumonia 1983  . GERD (gastroesophageal reflux  disease)     PAST SURGICAL HISTORY: Past Surgical History  Procedure Laterality Date  . Appendectomy    . Coronary angioplasty with stent placement      FAMILY HISTORY Family History  Problem Relation Age of Onset  . Cancer - Lung Mother   . CAD Mother   . Cancer - Lung Father     ADVANCED DIRECTIVES:  No flowsheet data found.  HEALTH MAINTENANCE: Social History  Substance Use Topics  . Smoking status: Former Smoker -- 5.00 packs/day for 35 years    Quit date: 08/23/2004  . Smokeless tobacco: Never Used  . Alcohol Use: No      No Known Allergies  Current Outpatient Prescriptions  Medication Sig Dispense Refill  . aspirin 81 MG tablet Take 81 mg by mouth daily.    . budesonide-formoterol (SYMBICORT) 160-4.5 MCG/ACT inhaler Inhale 2 puffs into the lungs 2 (two) times daily. 1 Inhaler 12  . clopidogrel (PLAVIX) 75 MG tablet Take 1 tablet (75 mg total) by mouth daily. 30 tablet 3  . metoprolol tartrate (LOPRESSOR) 25 MG tablet Take 0.5 tablets (12.5 mg total) by mouth daily. 30 tablet 3  . mometasone-formoterol (DULERA) 100-5 MCG/ACT AERO Inhale 2 puffs into the lungs 2 (two) times daily.    . ranitidine (ZANTAC) 150 MG tablet Take 150 mg by mouth 2 (two) times daily.    . simvastatin (ZOCOR) 40 MG tablet Take 1 tablet (40 mg total) by mouth daily. 30 tablet 3  . amoxicillin-clavulanate (  AUGMENTIN) 875-125 MG per tablet Take 1 tablet by mouth 2 (two) times daily. 20 tablet 0   No current facility-administered medications for this visit.    OBJECTIVE:  Filed Vitals:   09/01/14 1042  BP: 146/90  Pulse: 66  Temp: 97.6 F (36.4 C)     Body mass index is 27.31 kg/(m^2).    ECOG FS:0 - Asymptomatic  PHYSICAL EXAM: General  status: Performance status is good.  Patient has not lost significant weight HEENT: No evidence of stomatitis. Sclera and conjunctivae :: No jaundice.   pale looking. Lungs: Air  entry equal on both sides.  No rhonchi.  No rales.  Cardiac: Heart  sounds are normal.  No pericardial rub.  No murmur. Lymphatic system: Cervical, axillary, inguinal, lymph nodes not palpable GI: Abdomen is soft.  No ascites.  Liver spleen not palpable.  No tenderness.  Bowel sounds are within normal limit Lower extremity: No edema Neurological system: Higher functions, cranial nerves intact no evidence of peripheral neuropathy. Skin: No rash.  No ecchymosis.Marland Kitchen   LAB RESULTS:  CBC Latest Ref Rng 08/24/2014 06/23/2014  WBC 3.8 - 10.6 K/uL 11.8(H) 10.5  Hemoglobin 13.0 - 18.0 g/dL 14.1 14.9  Hematocrit 40.0 - 52.0 % 43.5 44.1  Platelets 150 - 440 K/uL 333 250    No visits with results within 5 Day(s) from this visit. Latest known visit with results is:  Hospital Outpatient Visit on 08/24/2014  Component Date Value Ref Range Status  . aPTT 08/24/2014 29  24 - 36 seconds Final  . WBC 08/24/2014 11.8* 3.8 - 10.6 K/uL Final  . RBC 08/24/2014 5.22  4.40 - 5.90 MIL/uL Final  . Hemoglobin 08/24/2014 14.1  13.0 - 18.0 g/dL Final  . HCT 08/24/2014 43.5  40.0 - 52.0 % Final  . MCV 08/24/2014 83.5  80.0 - 100.0 fL Final  . MCH 08/24/2014 27.0  26.0 - 34.0 pg Final  . MCHC 08/24/2014 32.4  32.0 - 36.0 g/dL Final  . RDW 08/24/2014 14.0  11.5 - 14.5 % Final  . Platelets 08/24/2014 333  150 - 440 K/uL Final  . Prothrombin Time 08/24/2014 13.4  11.4 - 15.0 seconds Final  . INR 08/24/2014 1.00   Final  . CYTOLOGY - NON GYN 08/24/2014    Final                   Value:Cytology - Non PAP CASE: [id number] PATIENT: Ronan Kregel Non-Gyn Cytology Report     SPECIMEN SUBMITTED: A. Lung, left  CLINICAL HISTORY: History of smoking and COPD, now with indeterminate cystic lesion within the anterior mediastinum/LUL.  PRE-OPERATIVE DIAGNOSIS: None Provided  POST-OPERATIVE DIAGNOSIS: None Provided     DIAGNOSIS: A. LEFT LUNG; FINE-NEEDLE ASPIRATION: - ABUNDANT ACUTE INFLAMMATION. - NEGATIVE FOR MALIGNANT CELLS.  Note: Thin prep, cell block and Wright  stained slides are included in the interpretation of this case.   GROSS DESCRIPTION:  A. Specimen Labeled: cytology left lung Volume: 55 mL Description: cloudy yellow Cell block(s): 1 and 1 ThinPrep Final Diagnosis performed by Delorse Lek, MD.  Electronically signed 08/25/2014 11:02:12AM    The electronic signature indicates that the named Attending Pathologist has evaluated the specimen  Technical component performed at [facility], [address] [place], [place]                         [place] Lab: [phone] Dir: Darrick Penna. Evette Doffing, MD  Professional component performed at [facility], [place]  [facility], [place], [place],  [address] Lab: [phone] Dir: Dellia Nims. Reuel Derby, MD    . Specimen Description 08/24/2014 DRAINAGE   Final  . Special Requests 08/24/2014 Normal   Final  . Gram Stain 08/24/2014    Final                   Value:MANY WBC SEEN NO ORGANISMS SEEN   . Culture 08/24/2014 RARE BACILLUS SPECIES   Final  . Report Status 08/24/2014 08/28/2014 FINAL   Final        ASSESSMENT: Complex cyst in the left upper lobe aspiration was negative for any malignant cells acute inflammation  MEDICAL DECISION MAKING:  As she will be started on a short course of Augmentin.  Reevaluation with another CT scan if cyst persists then infectious disease consultation will be   Obtained  Patient expressed understanding and was in agreement with this plan. He also understands that He can call clinic at any time with any questions, concerns, or complaints.    No matching staging information was found for the patient.  Forest Gleason, MD   10/16/2014 7:59 PM

## 2014-10-17 ENCOUNTER — Telehealth: Payer: Self-pay | Admitting: *Deleted

## 2014-10-17 DIAGNOSIS — J984 Other disorders of lung: Secondary | ICD-10-CM

## 2014-10-17 NOTE — Telephone Encounter (Signed)
-----   Message from Forest Gleason, MD sent at 10/16/2014  8:08 PM EDT ----- Regarding: Appointment We need to make follow-up appointment with another CT scan of chest without contrast for left upper lobe cyst follow-up N with CBC and comprehensive metabolic panel by  End of  October

## 2014-10-17 NOTE — Telephone Encounter (Signed)
Called patient to inform him that MD will be scheduling a CT and follow up MD visit the end of October.  Patient will receive call regarding that appointment.  Verbalized understanding.

## 2014-11-15 ENCOUNTER — Ambulatory Visit: Admission: RE | Admit: 2014-11-15 | Payer: Self-pay | Source: Ambulatory Visit

## 2014-11-17 ENCOUNTER — Other Ambulatory Visit: Payer: Self-pay

## 2014-11-17 ENCOUNTER — Ambulatory Visit: Payer: Self-pay | Admitting: Oncology

## 2015-01-17 ENCOUNTER — Ambulatory Visit: Payer: Self-pay

## 2015-01-24 ENCOUNTER — Inpatient Hospital Stay: Payer: Self-pay

## 2015-01-24 ENCOUNTER — Inpatient Hospital Stay: Payer: Self-pay | Admitting: Oncology

## 2015-01-27 ENCOUNTER — Ambulatory Visit: Payer: Self-pay | Admitting: Internal Medicine

## 2015-02-14 ENCOUNTER — Inpatient Hospital Stay: Payer: Self-pay | Admitting: Oncology

## 2015-02-14 ENCOUNTER — Inpatient Hospital Stay: Payer: Self-pay

## 2015-02-28 ENCOUNTER — Encounter: Payer: Self-pay | Admitting: *Deleted

## 2015-02-28 ENCOUNTER — Inpatient Hospital Stay: Payer: Self-pay | Admitting: Oncology

## 2015-02-28 ENCOUNTER — Inpatient Hospital Stay: Payer: Self-pay | Attending: Oncology

## 2016-07-08 ENCOUNTER — Telehealth: Payer: Self-pay | Admitting: *Deleted

## 2016-07-08 DIAGNOSIS — Z87891 Personal history of nicotine dependence: Secondary | ICD-10-CM

## 2016-07-08 NOTE — Telephone Encounter (Signed)
Received referral for initial lung cancer screening scan. Contacted patient and obtained smoking history,(former, 08/23/04, 175 pack year) as well as answering questions related to screening process. Patient denies signs of lung cancer such as weight loss or hemoptysis. Patient denies comorbidity that would prevent curative treatment if lung cancer were found. Patient is scheduled for shared decision making visit and CT scan on 07/30/16.

## 2016-07-30 ENCOUNTER — Inpatient Hospital Stay: Payer: Self-pay | Admitting: Oncology

## 2016-07-30 ENCOUNTER — Ambulatory Visit: Admission: RE | Admit: 2016-07-30 | Payer: Self-pay | Source: Ambulatory Visit

## 2016-07-30 NOTE — Progress Notes (Deleted)
In accordance with CMS guidelines, patient has met eligibility criteria including age, absence of signs or symptoms of lung cancer.  Social History  Substance Use Topics  . Smoking status: Former Smoker    Packs/day: 5.00    Years: 35.00    Quit date: 08/23/2004  . Smokeless tobacco: Never Used  . Alcohol use No     A shared decision-making session was conducted prior to the performance of CT scan. This includes one or more decision aids, includes benefits and harms of screening, follow-up diagnostic testing, over-diagnosis, false positive rate, and total radiation exposure.  Counseling on the importance of adherence to annual lung cancer LDCT screening, impact of co-morbidities, and ability or willingness to undergo diagnosis and treatment is imperative for compliance of the program.  Counseling on the importance of continued smoking cessation for former smokers; the importance of smoking cessation for current smokers, and information about tobacco cessation interventions have been given to patient including [place] and 1800 quit  programs.  Written order for lung cancer screening with LDCT has been given to the patient and any and all questions have been answered to the best of my abilities.   Yearly follow up will be coordinated by Burgess Estelle, Thoracic Navigator.  Faythe Casa, NP 07/30/2016 1:17 PM

## 2016-08-28 ENCOUNTER — Telehealth: Payer: Self-pay | Admitting: *Deleted

## 2016-08-28 NOTE — Telephone Encounter (Signed)
Contacted to inquire about lung screening CT scan. Patient would like to wait until Fall to consider scan due to work schedule.

## 2016-12-01 ENCOUNTER — Emergency Department
Admission: EM | Admit: 2016-12-01 | Discharge: 2016-12-02 | Disposition: A | Discharge status: Home / Self Care | Payer: Self-pay | Attending: Emergency Medicine | Admitting: Emergency Medicine

## 2016-12-01 DIAGNOSIS — N189 Chronic kidney disease, unspecified: Secondary | ICD-10-CM | POA: Insufficient documentation

## 2016-12-01 DIAGNOSIS — J45909 Unspecified asthma, uncomplicated: Secondary | ICD-10-CM | POA: Insufficient documentation

## 2016-12-01 DIAGNOSIS — J449 Chronic obstructive pulmonary disease, unspecified: Secondary | ICD-10-CM | POA: Insufficient documentation

## 2016-12-01 DIAGNOSIS — Z7902 Long term (current) use of antithrombotics/antiplatelets: Secondary | ICD-10-CM | POA: Insufficient documentation

## 2016-12-01 DIAGNOSIS — I2510 Atherosclerotic heart disease of native coronary artery without angina pectoris: Secondary | ICD-10-CM | POA: Insufficient documentation

## 2016-12-01 DIAGNOSIS — Z87891 Personal history of nicotine dependence: Secondary | ICD-10-CM | POA: Insufficient documentation

## 2016-12-01 DIAGNOSIS — R42 Dizziness and giddiness: Secondary | ICD-10-CM | POA: Insufficient documentation

## 2016-12-01 DIAGNOSIS — Z79899 Other long term (current) drug therapy: Secondary | ICD-10-CM | POA: Insufficient documentation

## 2016-12-01 DIAGNOSIS — Z955 Presence of coronary angioplasty implant and graft: Secondary | ICD-10-CM | POA: Insufficient documentation

## 2016-12-01 DIAGNOSIS — I129 Hypertensive chronic kidney disease with stage 1 through stage 4 chronic kidney disease, or unspecified chronic kidney disease: Secondary | ICD-10-CM | POA: Insufficient documentation

## 2016-12-01 LAB — BASIC METABOLIC PANEL
Anion gap: 17 — ABNORMAL HIGH (ref 5–15)
BUN: UNDETERMINED mg/dL (ref 6–20)
CALCIUM: 9.2 mg/dL (ref 8.9–10.3)
CHLORIDE: 107 mmol/L (ref 101–111)
CO2: 15 mmol/L — ABNORMAL LOW (ref 22–32)
CREATININE: UNDETERMINED mg/dL (ref 0.61–1.24)
Glucose, Bld: 104 mg/dL — ABNORMAL HIGH (ref 65–99)
Potassium: 4.1 mmol/L (ref 3.5–5.1)
SODIUM: 139 mmol/L (ref 135–145)

## 2016-12-01 LAB — CBC
HCT: 47.5 % (ref 40.0–52.0)
Hemoglobin: 15.8 g/dL (ref 13.0–18.0)
MCH: 28.7 pg (ref 26.0–34.0)
MCHC: 33.2 g/dL (ref 32.0–36.0)
MCV: 86.2 fL (ref 80.0–100.0)
PLATELETS: 286 10*3/uL (ref 150–440)
RBC: 5.51 MIL/uL (ref 4.40–5.90)
RDW: 13.8 % (ref 11.5–14.5)
WBC: 10.5 10*3/uL (ref 3.8–10.6)

## 2016-12-01 LAB — TROPONIN I

## 2016-12-01 NOTE — ED Notes (Signed)
Pt found in room att; Robin, Lab, called insufficient amount in green top to result BUN and creatine, told lab to result what's available

## 2016-12-01 NOTE — ED Triage Notes (Signed)
Pt states about one hour pta he became dizzy after getting out of a vehicle and ambulating. Pt states dizziness lasted approx 30 minutes and has resolved. Pt denies other symptoms including pain, change in vision, difficulty walking, speaking or ambulating. Pt denies diaphoresis, nausea. Pt currently appears in no acute distress.

## 2016-12-01 NOTE — ED Notes (Signed)
Spoke with dr. Jacqualine Code regarding pt's symptoms, no order for head ct received at this time.

## 2016-12-02 NOTE — ED Provider Notes (Signed)
[facility] Emergency Department Provider Note  ____________________________________________   First MD Initiated Contact with Patient 12/02/16 0001     (approximate)  I have reviewed the triage vital signs and the nursing notes.   HISTORY  Chief Complaint Dizziness    HPI Terry Freeman is a 61 y.o. male with medical history as listed below but who is quite healthy and active who presents for evaluation of a relatively brief episode of acute onset and severe dizziness.  He had a normal day today and had finished dinner with his son.  He was ambulating and became acutely dizzy, almost falling over and requiring his son to grab him to  prevent him from falling.  He felt dizzy for about 30 minutes in which time the symptoms gradually but relatively rapidly resolved.  He feels completely normal at this time and has for several hours.  He called EMS at the time who recommended that he come in for evaluation.  His blood pressure was checked during the episode and it was significantly elevated but it is back to normal at this time.  He denies headache, visual changes, slurred speech, facial droop, nausea, vomiting, chest pain, shortness of breath, abdominal pain, and numbness/weakness in any of his extremities.  He is ambulatory without any difficulty, has no gait disturbance, and is completely at his baseline.  The onset of the episode was acute on the symptoms were at least moderate if not severe but resolved quickly his wife is present with him at bedside and confirms the history.  Past Medical History:  Diagnosis Date  . Asthma   . Chronic kidney disease   . Collagen vascular disease   . COPD (chronic obstructive pulmonary disease)   . Coronary artery disease   . GERD (gastroesophageal reflux disease)   . Hypertension   . Myocardial infarction ([place])   . Pneumonia 1983  . Reflux     Patient Active Problem List   Diagnosis Date Noted  . Lung mass  08/18/2014  . Chest pain 06/22/2014  . Abnormal CT scan, chest 06/22/2014  . CAD (coronary artery disease) 06/22/2014  . Leucocytosis 06/22/2014  . HTN (hypertension) 06/22/2014  . COPD (chronic obstructive pulmonary disease) ([place]) 06/22/2014  . CKD (chronic kidney disease) 06/22/2014  . HLD (hyperlipidemia) 06/22/2014  . CAFL (chronic airflow limitation) ([place]) 06/22/2014  . Atherosclerosis of coronary artery 06/22/2014  . Essential (primary) hypertension 06/22/2014    Past Surgical History:  Procedure Laterality Date  . APPENDECTOMY    . CORONARY ANGIOPLASTY WITH STENT PLACEMENT      Prior to Admission medications   Medication Sig Start Date End Date Taking? Authorizing Provider  amoxicillin-clavulanate (AUGMENTIN) 875-125 MG per tablet Take 1 tablet by mouth 2 (two) times daily. 09/01/14   Forest Gleason, MD  aspirin 81 MG tablet Take 81 mg by mouth daily.    [provider]  budesonide-formoterol (SYMBICORT) 160-4.5 MCG/ACT inhaler Inhale 2 puffs into the lungs 2 (two) times daily. 06/23/14   Aldean Jewett, MD  clopidogrel (PLAVIX) 75 MG tablet Take 1 tablet (75 mg total) by mouth daily. 06/23/14   Aldean Jewett, MD  metoprolol tartrate (LOPRESSOR) 25 MG tablet Take 0.5 tablets (12.5 mg total) by mouth daily. 06/23/14   Aldean Jewett, MD  mometasone-formoterol (DULERA) 100-5 MCG/ACT AERO Inhale 2 puffs into the lungs 2 (two) times daily.    [provider]  ranitidine (ZANTAC) 150 MG tablet Take 150 mg by mouth  2 (two) times daily.    [provider]  simvastatin (ZOCOR) 40 MG tablet Take 1 tablet (40 mg total) by mouth daily. 06/23/14   Aldean Jewett, MD    Allergies Patient has no known allergies.  Family History  Problem Relation Age of Onset  . Cancer - Lung Mother   . CAD Mother   . Cancer - Lung Father     Social History Social History   Tobacco Use  . Smoking status: Former Smoker    Packs/day: 5.00    Years: 35.00     Pack years: 175.00    Last attempt to quit: 08/23/2004    Years since quitting: 12.2  . Smokeless tobacco: Never Used  Substance Use Topics  . Alcohol use: No  . Drug use: No    Comment: Quit smoking 10 years ago...    Review of Systems Constitutional: No fever/chills Eyes: No visual changes. ENT: No sore throat. Cardiovascular: Denies chest pain. Respiratory: Denies shortness of breath. Gastrointestinal: No abdominal pain.  No nausea, no vomiting.  No diarrhea.  No constipation. Genitourinary: Negative for dysuria. Musculoskeletal: Negative for neck pain.  Negative for back pain. Integumentary: Negative for rash. Neurological: Acute onset episode of dizziness, relatively brief, completely resolved. Negative for headaches, focal weakness or numbness.   ____________________________________________   PHYSICAL EXAM:  VITAL SIGNS: ED Triage Vitals [12/01/16 2133]  Enc Vitals Group     BP (!) 169/102     Pulse Rate 72     Resp 18     Temp 98.8 F (37.1 C)     Temp Source Oral     SpO2 100 %     Weight 103.4 kg (228 lb)     Height 1.88 m (6\' 2" )     Head Circumference      Peak Flow      Pain Score      Pain Loc      Pain Edu?      Excl. in [place]?     Constitutional: Alert and oriented. Well appearing and in no acute distress. Eyes: Conjunctivae are normal. PERRL. EOMI. Head: Atraumatic. Nose: No congestion/rhinnorhea. Mouth/Throat: Mucous membranes are moist. Neck: No stridor.  No meningeal signs.   Cardiovascular: Normal rate, regular rhythm. Good peripheral circulation. Grossly normal heart sounds. Respiratory: Normal respiratory effort.  No retractions. Lungs CTAB. Gastrointestinal: Soft and nontender. No distention.  Musculoskeletal: No lower extremity tenderness nor edema. No gross deformities of extremities. Neurologic:  Normal speech and language. No gross focal neurologic deficits are appreciated.  He has good grip strength bilaterally, no deficits in his upper  or lower extremities, no cerebellar deficits, and no gross cranial nerve deficits.  He is ambulating with a steady and normal gait and without any difficulties. Skin:  Skin is warm, dry and intact. No rash noted. Psychiatric: Mood and affect are normal. Speech and behavior are normal.  ____________________________________________   LABS (all labs ordered are listed, but only abnormal results are displayed)  Labs Reviewed  BASIC METABOLIC PANEL - Abnormal; Notable for the following components:      Result Value   CO2 15 (*)    Glucose, Bld 104 (*)    Anion gap 17 (*)    All other components within normal limits  CBC  TROPONIN I   ____________________________________________  EKG  ED ECG REPORT I, Luz Mares, the attending physician, personally viewed and interpreted this ECG.  Date: 12/01/2016 EKG Time: 21:36 Rate: 77 Rhythm: normal  sinus rhythm QRS Axis: normal Intervals: normal ST/T Wave abnormalities: normal Narrative Interpretation: no evidence of acute ischemia  ____________________________________________  RADIOLOGY   No results found.  ____________________________________________   PROCEDURES  Critical Care performed: No   Procedure(s) performed:   Procedures   ____________________________________________   INITIAL IMPRESSION / ASSESSMENT AND PLAN / ED COURSE  As part of my medical decision making, I reviewed the following data within the electronic MEDICAL RECORD NUMBER History obtained from family, Nursing notes reviewed and incorporated, Labs reviewed  and Old chart reviewed    Differential diagnosis includes, but is not limited to, CVA/TIA, vasovagal episode, orthostatic hypotension, metabolic abnormality, vertebral vascular insufficiency, ACS, etc.  In terms of acute or emergent conditions, TIA seems most likely given that the onset was acute and the dizziness was sufficient to cause him to have to steady himself and have his son study him as  well.  The symptoms have completely resolved and did so within 30 minutes and he has been asymptomatic for several hours.  He already takes a full dose aspirin every day and is quite healthy in spite of some chronic medical issues and very active.  He has a primary care doctor and goes to see Dr. Clayborn Bigness for cardiology.  I had an extensive conversation with the patient and his wife about TIA versus CVA versus the other less emergent items on the differential diagnosis.  Explained that I did not think a CT head noncontrast would be particularly useful given that he has no persistent symptoms and the chances of an intracranial hemorrhage are almost zero based on his exam lack of other symptoms.  Similarly, it is incredibly unlikely to show any evidence of CVA given that his symptoms were so mild and transient.  We discussed MRI at length, but I also explained that it is very unlikely an MRI would show any abnormalities given that he has been asymptomatic for hours and had only a brief episode, and that by definition a TIA does not have any radiographic evidence.  I offered to get the study but they prefer not to do so given the small probability that it will show any abnormalities and the length of time it would require tonight.  I explained that frequently for TIAs we will admit patients to the hospital for stroke risk stratification but he definitely does not want to stay in the hospital.  Given that he is asymptomatic, reliable, and has the ability to follow-up as an outpatient, I think it is appropriate to discharge him at this time.  However I gave strict return precautions and I did explain that an undiagnosed or investigated CVA/TIA can lead to a much worse CVA that can lead to disability or even death.  I did however explain that this probability is very low.  They understand and are comfortable with the plan for discharge and outpatient follow-up and he promises to return immediately if he develops any new  or worsening symptoms.   Of note, the blood sample was insufficient to get a BUN and creatinine result.  His metabolic panel was generally unremarkable.  He had a very slightly elevated anion gap but is able to tolerate fluids, he has no history of diabetes, feels "great", and I do not feel obtaining additional blood for another sample is necessary.  I encouraged him to drink plenty of fluids and follow up as an outpatient, and he agrees.  ____________________________________________  FINAL CLINICAL IMPRESSION(S) / ED DIAGNOSES  Final diagnoses:  Dizziness     MEDICATIONS GIVEN DURING THIS VISIT:  Medications - No data to display   ED Discharge Orders    None       Note:  This document was prepared using Dragon voice recognition software and may include unintentional dictation errors.    Hinda Kehr, MD 12/02/16 [phone]

## 2016-12-02 NOTE — Discharge Instructions (Signed)
As we discussed, it is difficult to diagnose exactly what caused your episode because of the symptoms completely resolved and your workup this evening was reassuring.  We discussed in detail whether or not to obtain an MRI to look for any signs of stroke, but I explained that it is extremely unlikely that anything abnormal would show up on your study given that you are completely asymptomatic at this time.  We discussed the fact that a mini stroke, called a transient ischemic attack (TIA), by definition does not show up on an MRI.  I offered to obtain the study but you declined at this time which I think is understandable and appropriate under the circumstances.  Recommend that you schedule the next available follow-up appointment with your primary care doctor and discuss whether they would recommend any additional outpatient testing for stroke risk stratification (such as carotid artery Dopplers, etc.).    Please drink plenty of clear fluids and stay hydrated.  Continue taking your daily full dose (325 mg) aspirin.  Return immediately to the emergency department if you develop any similar, new, or worsening symptoms that concern you.

## 2016-12-02 NOTE — ED Notes (Signed)
ED Provider at bedside. 

## 2016-12-20 ENCOUNTER — Telehealth: Payer: Self-pay | Admitting: *Deleted

## 2016-12-20 NOTE — Telephone Encounter (Signed)
Received referral for low dose lung cancer screening CT scan. Patient desires to consider screening scan in January.

## 2017-01-24 ENCOUNTER — Telehealth: Payer: Self-pay | Admitting: *Deleted

## 2017-01-24 DIAGNOSIS — Z87891 Personal history of nicotine dependence: Secondary | ICD-10-CM

## 2017-01-24 DIAGNOSIS — Z122 Encounter for screening for malignant neoplasm of respiratory organs: Secondary | ICD-10-CM

## 2017-01-24 NOTE — Telephone Encounter (Signed)
Received referral for initial lung cancer screening scan. Contacted patient and obtained smoking history,(former, quit 08/23/04, 175 pack year) as well as answering questions related to screening process. Patient denies signs of lung cancer such as weight loss or hemoptysis. Patient denies comorbidity that would prevent curative treatment if lung cancer were found. Patient is scheduled for shared decision making visit and CT scan on 02/19/17.

## 2017-02-19 ENCOUNTER — Ambulatory Visit
Admission: RE | Admit: 2017-02-19 | Discharge: 2017-02-19 | Disposition: A | Discharge status: Home / Self Care | Payer: Self-pay | Source: Ambulatory Visit | Attending: Nurse Practitioner | Admitting: Nurse Practitioner

## 2017-02-19 ENCOUNTER — Inpatient Hospital Stay: Payer: Self-pay | Attending: Nurse Practitioner | Admitting: Nurse Practitioner

## 2017-02-19 DIAGNOSIS — Z87891 Personal history of nicotine dependence: Secondary | ICD-10-CM | POA: Insufficient documentation

## 2017-02-19 DIAGNOSIS — K449 Diaphragmatic hernia without obstruction or gangrene: Secondary | ICD-10-CM | POA: Insufficient documentation

## 2017-02-19 DIAGNOSIS — I700 Atherosclerosis of aorta: Secondary | ICD-10-CM | POA: Insufficient documentation

## 2017-02-19 DIAGNOSIS — Z122 Encounter for screening for malignant neoplasm of respiratory organs: Secondary | ICD-10-CM | POA: Insufficient documentation

## 2017-02-19 DIAGNOSIS — I2510 Atherosclerotic heart disease of native coronary artery without angina pectoris: Secondary | ICD-10-CM | POA: Insufficient documentation

## 2017-02-19 DIAGNOSIS — J439 Emphysema, unspecified: Secondary | ICD-10-CM | POA: Insufficient documentation

## 2017-02-19 NOTE — Progress Notes (Signed)
In accordance with CMS guidelines, patient has met eligibility criteria including age, absence of signs or symptoms of lung cancer.  Social History   Tobacco Use  . Smoking status: Former Smoker    Packs/day: 5.00    Years: 35.00    Pack years: 175.00    Last attempt to quit: 08/23/2004    Years since quitting: 12.5  . Smokeless tobacco: Never Used  Substance Use Topics  . Alcohol use: No  . Drug use: No    Comment: Quit smoking 10 years ago...     A shared decision-making session was conducted prior to the performance of CT scan. This includes one or more decision aids, includes benefits and harms of screening, follow-up diagnostic testing, over-diagnosis, false positive rate, and total radiation exposure.  Counseling on the importance of adherence to annual lung cancer LDCT screening, impact of co-morbidities, and ability or willingness to undergo diagnosis and treatment is imperative for compliance of the program.  Counseling on the importance of continued smoking cessation for former smokers; the importance of smoking cessation for current smokers, and information about tobacco cessation interventions have been given to patient including [place] [facility] and [employer] quit [place] programs.  Written order for lung cancer screening with LDCT has been given to the patient and any and all questions have been answered to the best of my abilities.   Yearly follow up will be coordinated by Shawn Perkins, Thoracic Navigator.  Lauren Allen, DNP, AGNP-C [facility] [phone] (ascom) [phone] (office) 02/19/17 4:52 PM   

## 2017-02-21 ENCOUNTER — Telehealth: Payer: Self-pay | Admitting: *Deleted

## 2017-02-21 NOTE — Telephone Encounter (Signed)
Notified patient of LDCT lung cancer screening program results with recommendation for 6 month follow up imaging. Also notified of incidental findings noted below and is encouraged to discuss further with PCP who will receive a copy of this note and/or the CT report. Patient verbalizes understanding.   IMPRESSION: 1. Lung-RADS 3, probably benign findings. Short-term follow-up in 6 months is recommended with repeat low-dose chest CT without contrast (please use the following order, "CT CHEST LCS NODULE FOLLOW-UP W/O CM"). 2. Irregular nodular and bandlike consolidation in the apical right upper lobe and anteromedial left upper lobe with tiny internal calcifications, both decreased since 06/21/2014 chest CT, most compatible with postinfectious scarring. 3. Two vessel coronary atherosclerosis. 4. Small hiatal hernia.  Aortic Atherosclerosis (ICD10-I70.0) and Emphysema (ICD10-J43.9).   Electronically Signed By: Janina Mayo.D.

## 2017-08-21 ENCOUNTER — Telehealth: Payer: Self-pay | Admitting: *Deleted

## 2017-08-21 DIAGNOSIS — Z87891 Personal history of nicotine dependence: Secondary | ICD-10-CM

## 2017-08-21 DIAGNOSIS — R918 Other nonspecific abnormal finding of lung field: Secondary | ICD-10-CM

## 2017-08-21 DIAGNOSIS — Z122 Encounter for screening for malignant neoplasm of respiratory organs: Secondary | ICD-10-CM

## 2017-08-21 NOTE — Telephone Encounter (Signed)
Notified patient that lung cancer screening low dose CT scan follow up is due currently or will be in near future. Confirmed that patient is within the age range of 55-77, and asymptomatic, (no signs or symptoms of lung cancer). Patient denies illness that would prevent curative treatment for lung cancer if found. Verified smoking history, (former, quit 08/23/04 175 pack year). The shared decision making visit was done 02/19/17. Patient is agreeable for CT scan being scheduled.

## 2017-09-01 ENCOUNTER — Ambulatory Visit
Admission: RE | Admit: 2017-09-01 | Discharge: 2017-09-01 | Disposition: A | Discharge status: Home / Self Care | Payer: Self-pay | Source: Ambulatory Visit | Attending: Nurse Practitioner | Admitting: Nurse Practitioner

## 2017-09-01 DIAGNOSIS — R918 Other nonspecific abnormal finding of lung field: Secondary | ICD-10-CM

## 2017-09-01 DIAGNOSIS — J439 Emphysema, unspecified: Secondary | ICD-10-CM | POA: Insufficient documentation

## 2017-09-01 DIAGNOSIS — K8020 Calculus of gallbladder without cholecystitis without obstruction: Secondary | ICD-10-CM | POA: Insufficient documentation

## 2017-09-01 DIAGNOSIS — Z87891 Personal history of nicotine dependence: Secondary | ICD-10-CM

## 2017-09-01 DIAGNOSIS — I2510 Atherosclerotic heart disease of native coronary artery without angina pectoris: Secondary | ICD-10-CM | POA: Insufficient documentation

## 2017-09-01 DIAGNOSIS — I700 Atherosclerosis of aorta: Secondary | ICD-10-CM | POA: Insufficient documentation

## 2017-09-01 DIAGNOSIS — Z122 Encounter for screening for malignant neoplasm of respiratory organs: Secondary | ICD-10-CM

## 2017-09-02 ENCOUNTER — Telehealth: Payer: Self-pay | Admitting: *Deleted

## 2017-09-02 ENCOUNTER — Encounter: Payer: Self-pay | Admitting: *Deleted

## 2017-09-02 NOTE — Telephone Encounter (Signed)
Notified patient of LDCT lung cancer screening program results with recommendation for 12 month follow up imaging. Also notified of incidental findings noted below and is encouraged to discuss further with PCP who will receive a copy of this note and/or the CT report. Patient verbalizes understanding.   IMPRESSION: 1. Lung-RADS 2, benign appearance or behavior. Continue annual screening with low-dose chest CT without contrast in 12 months. 2. Aortic atherosclerosis (ICD10-170.0). Coronary artery calcification. 3. Emphysema (ICD10-J43.9). 4. Cholelithiasis.

## 2018-09-15 ENCOUNTER — Telehealth: Payer: Self-pay | Admitting: *Deleted

## 2018-09-15 NOTE — Telephone Encounter (Signed)
Unable to leave message for patient, Voice mailbox not set up.

## 2018-09-24 ENCOUNTER — Telehealth: Payer: Self-pay | Admitting: *Deleted

## 2018-09-24 NOTE — Telephone Encounter (Signed)
Attempted to contact regarding lung screening scan. However there is no answer or voicemail option.

## 2018-10-23 ENCOUNTER — Encounter: Payer: Self-pay | Admitting: *Deleted

## 2018-12-22 ENCOUNTER — Telehealth: Payer: Self-pay | Admitting: *Deleted

## 2018-12-22 DIAGNOSIS — Z122 Encounter for screening for malignant neoplasm of respiratory organs: Secondary | ICD-10-CM

## 2018-12-22 DIAGNOSIS — Z87891 Personal history of nicotine dependence: Secondary | ICD-10-CM

## 2018-12-22 NOTE — Telephone Encounter (Signed)
Patient has been notified that annual lung cancer screening low dose CT scan is due currently or will be in near future. Confirmed that patient is within the age range of 55-77, and asymptomatic, (no signs or symptoms of lung cancer). Patient denies illness that would prevent curative treatment for lung cancer if found. Verified smoking history, (former, quit 08/23/04, 175 pack year). The shared decision making visit was done 02/19/17. Patient is agreeable for CT scan being scheduled.

## 2018-12-23 ENCOUNTER — Other Ambulatory Visit: Payer: Self-pay

## 2018-12-23 ENCOUNTER — Ambulatory Visit
Admission: RE | Admit: 2018-12-23 | Discharge: 2018-12-23 | Disposition: A | Discharge status: Home / Self Care | Payer: Self-pay | Source: Ambulatory Visit | Attending: Nurse Practitioner | Admitting: Nurse Practitioner

## 2018-12-23 DIAGNOSIS — Z87891 Personal history of nicotine dependence: Secondary | ICD-10-CM | POA: Insufficient documentation

## 2018-12-23 DIAGNOSIS — Z122 Encounter for screening for malignant neoplasm of respiratory organs: Secondary | ICD-10-CM | POA: Insufficient documentation

## 2018-12-24 ENCOUNTER — Telehealth: Payer: Self-pay | Admitting: *Deleted

## 2018-12-24 ENCOUNTER — Ambulatory Visit: Admission: RE | Admit: 2018-12-24 | Payer: Self-pay | Source: Ambulatory Visit

## 2018-12-24 NOTE — Telephone Encounter (Signed)
Notified patient of LDCT lung cancer screening program results with recommendation for 12 month follow up imaging. Also notified of incidental findings noted below and is encouraged to discuss further with PCP who will receive a copy of this note and/or the CT report. Patient verbalizes understanding.   IMPRESSION: 1. Lung-RADS 2, benign appearance or behavior. Continue annual screening with low-dose chest CT without contrast in 12 months. 2. Emphysema and aortic atherosclerosis. 3. Three vessel coronary artery calcifications.  Aortic Atherosclerosis (ICD10-I70.0) and Emphysema (ICD10-J43.9).

## 2019-12-15 ENCOUNTER — Other Ambulatory Visit: Payer: Self-pay | Admitting: *Deleted

## 2019-12-15 DIAGNOSIS — Z87891 Personal history of nicotine dependence: Secondary | ICD-10-CM

## 2019-12-15 DIAGNOSIS — Z122 Encounter for screening for malignant neoplasm of respiratory organs: Secondary | ICD-10-CM

## 2019-12-15 NOTE — Progress Notes (Signed)
Contacted and scheduled for lung screening scan. Patient is a former smoker, quit 15 years ago, 175 pack year history.

## 2020-01-06 ENCOUNTER — Ambulatory Visit
Admission: RE | Admit: 2020-01-06 | Discharge: 2020-01-06 | Disposition: A | Discharge status: Home / Self Care | Payer: Self-pay | Source: Ambulatory Visit | Attending: Oncology | Admitting: Oncology

## 2020-01-06 ENCOUNTER — Other Ambulatory Visit: Payer: Self-pay

## 2020-01-06 DIAGNOSIS — Z122 Encounter for screening for malignant neoplasm of respiratory organs: Secondary | ICD-10-CM | POA: Insufficient documentation

## 2020-01-06 DIAGNOSIS — Z87891 Personal history of nicotine dependence: Secondary | ICD-10-CM | POA: Insufficient documentation

## 2020-01-10 ENCOUNTER — Encounter: Payer: Self-pay | Admitting: *Deleted

## 2020-05-28 ENCOUNTER — Emergency Department: Payer: Self-pay

## 2020-05-28 ENCOUNTER — Inpatient Hospital Stay
Admission: EM | Admit: 2020-05-28 | Discharge: 2020-05-31 | DRG: 201 | Disposition: A | Discharge status: Home / Self Care | Payer: Self-pay | Attending: Internal Medicine | Admitting: Internal Medicine

## 2020-05-28 ENCOUNTER — Other Ambulatory Visit: Payer: Self-pay

## 2020-05-28 DIAGNOSIS — M359 Systemic involvement of connective tissue, unspecified: Secondary | ICD-10-CM | POA: Diagnosis present

## 2020-05-28 DIAGNOSIS — Z7902 Long term (current) use of antithrombotics/antiplatelets: Secondary | ICD-10-CM

## 2020-05-28 DIAGNOSIS — Z955 Presence of coronary angioplasty implant and graft: Secondary | ICD-10-CM

## 2020-05-28 DIAGNOSIS — Z7951 Long term (current) use of inhaled steroids: Secondary | ICD-10-CM

## 2020-05-28 DIAGNOSIS — I2510 Atherosclerotic heart disease of native coronary artery without angina pectoris: Secondary | ICD-10-CM | POA: Diagnosis present

## 2020-05-28 DIAGNOSIS — Z79899 Other long term (current) drug therapy: Secondary | ICD-10-CM

## 2020-05-28 DIAGNOSIS — J9311 Primary spontaneous pneumothorax: Principal | ICD-10-CM | POA: Diagnosis present

## 2020-05-28 DIAGNOSIS — J439 Emphysema, unspecified: Secondary | ICD-10-CM | POA: Diagnosis present

## 2020-05-28 DIAGNOSIS — Z20822 Contact with and (suspected) exposure to covid-19: Secondary | ICD-10-CM | POA: Diagnosis present

## 2020-05-28 DIAGNOSIS — Z8701 Personal history of pneumonia (recurrent): Secondary | ICD-10-CM

## 2020-05-28 DIAGNOSIS — Z8249 Family history of ischemic heart disease and other diseases of the circulatory system: Secondary | ICD-10-CM

## 2020-05-28 DIAGNOSIS — Z7982 Long term (current) use of aspirin: Secondary | ICD-10-CM

## 2020-05-28 DIAGNOSIS — E785 Hyperlipidemia, unspecified: Secondary | ICD-10-CM | POA: Diagnosis present

## 2020-05-28 DIAGNOSIS — J939 Pneumothorax, unspecified: Secondary | ICD-10-CM

## 2020-05-28 DIAGNOSIS — J441 Chronic obstructive pulmonary disease with (acute) exacerbation: Secondary | ICD-10-CM | POA: Diagnosis present

## 2020-05-28 DIAGNOSIS — I252 Old myocardial infarction: Secondary | ICD-10-CM

## 2020-05-28 DIAGNOSIS — K219 Gastro-esophageal reflux disease without esophagitis: Secondary | ICD-10-CM | POA: Diagnosis present

## 2020-05-28 DIAGNOSIS — N1831 Chronic kidney disease, stage 3a: Secondary | ICD-10-CM

## 2020-05-28 DIAGNOSIS — J930 Spontaneous tension pneumothorax: Secondary | ICD-10-CM

## 2020-05-28 DIAGNOSIS — I10 Essential (primary) hypertension: Secondary | ICD-10-CM | POA: Diagnosis present

## 2020-05-28 DIAGNOSIS — Z87891 Personal history of nicotine dependence: Secondary | ICD-10-CM

## 2020-05-28 DIAGNOSIS — I129 Hypertensive chronic kidney disease with stage 1 through stage 4 chronic kidney disease, or unspecified chronic kidney disease: Secondary | ICD-10-CM | POA: Diagnosis present

## 2020-05-28 LAB — CBC
HCT: 44.3 % (ref 39.0–52.0)
Hemoglobin: 15.1 g/dL (ref 13.0–17.0)
MCH: 28.9 pg (ref 26.0–34.0)
MCHC: 34.1 g/dL (ref 30.0–36.0)
MCV: 84.9 fL (ref 80.0–100.0)
Platelets: 240 10*3/uL (ref 150–400)
RBC: 5.22 MIL/uL (ref 4.22–5.81)
RDW: 13.2 % (ref 11.5–15.5)
WBC: 8.5 10*3/uL (ref 4.0–10.5)
nRBC: 0 % (ref 0.0–0.2)

## 2020-05-28 LAB — BASIC METABOLIC PANEL
Anion gap: 10 (ref 5–15)
BUN: 11 mg/dL (ref 8–23)
CO2: 25 mmol/L (ref 22–32)
Calcium: 9.2 mg/dL (ref 8.9–10.3)
Chloride: 103 mmol/L (ref 98–111)
Creatinine, Ser: 1.38 mg/dL — ABNORMAL HIGH (ref 0.61–1.24)
GFR, Estimated: 58 mL/min — ABNORMAL LOW (ref >60)
Glucose, Bld: 122 mg/dL — ABNORMAL HIGH (ref 70–99)
Potassium: 3.9 mmol/L (ref 3.5–5.1)
Sodium: 138 mmol/L (ref 135–145)

## 2020-05-28 LAB — TROPONIN I (HIGH SENSITIVITY)
Troponin I (High Sensitivity): 4 ng/L (ref <18)
Troponin I (High Sensitivity): 6 ng/L (ref <18)

## 2020-05-28 LAB — RESP PANEL BY RT-PCR (FLU A&B, COVID) ARPGX2
Influenza A by PCR: NEGATIVE
Influenza B by PCR: NEGATIVE
SARS Coronavirus 2 by RT PCR: NEGATIVE

## 2020-05-28 LAB — PROTIME-INR
INR: 1 (ref 0.8–1.2)
Prothrombin Time: 13.1 seconds (ref 11.4–15.2)

## 2020-05-28 MED ORDER — OXYCODONE HCL 5 MG PO TABS
5.0000 mg | ORAL_TABLET | Freq: Once | ORAL | Status: AC
  Administered 2020-05-28: 5 mg via ORAL
  Filled 2020-05-28: qty 1

## 2020-05-28 MED ORDER — LIDOCAINE-EPINEPHRINE 2 %-1:100000 IJ SOLN
20.0000 mL | Freq: Once | INTRAMUSCULAR | Status: AC
  Administered 2020-05-28: 20 mL
  Filled 2020-05-28: qty 20

## 2020-05-28 MED ORDER — HYDROCODONE-ACETAMINOPHEN 5-325 MG PO TABS
1.0000 | ORAL_TABLET | Freq: Four times a day (QID) | ORAL | Status: DC | PRN
Start: 2020-05-28 — End: 2020-05-31
  Administered 2020-05-28 – 2020-05-30 (×4): 1 via ORAL
  Filled 2020-05-28 (×4): qty 1

## 2020-05-28 MED ORDER — ACETAMINOPHEN 500 MG PO TABS
1000.0000 mg | ORAL_TABLET | Freq: Once | ORAL | Status: AC
  Administered 2020-05-28: 1000 mg via ORAL
  Filled 2020-05-28: qty 2

## 2020-05-28 MED ORDER — AZITHROMYCIN 500 MG PO TABS
500.0000 mg | ORAL_TABLET | Freq: Every day | ORAL | Status: DC
  Administered 2020-05-29 – 2020-05-31 (×3): 500 mg via ORAL
  Filled 2020-05-28 (×3): qty 1

## 2020-05-28 MED ORDER — IPRATROPIUM-ALBUTEROL 0.5-2.5 (3) MG/3ML IN SOLN
6.0000 mL | Freq: Once | RESPIRATORY_TRACT | Status: AC
  Administered 2020-05-28: 6 mL via RESPIRATORY_TRACT
  Filled 2020-05-28: qty 6

## 2020-05-28 MED ORDER — MORPHINE SULFATE (PF) 2 MG/ML IV SOLN
2.0000 mg | INTRAVENOUS | Status: DC | PRN
Start: 2020-05-28 — End: 2020-05-31

## 2020-05-28 MED ORDER — ASPIRIN EC 81 MG PO TBEC
81.0000 mg | DELAYED_RELEASE_TABLET | Freq: Every day | ORAL | Status: DC
  Administered 2020-05-29 – 2020-05-31 (×3): 81 mg via ORAL
  Filled 2020-05-28 (×3): qty 1

## 2020-05-28 MED ORDER — FENTANYL CITRATE (PF) 100 MCG/2ML IJ SOLN
50.0000 ug | Freq: Once | INTRAMUSCULAR | Status: AC
  Administered 2020-05-28: 50 ug via INTRAVENOUS
  Filled 2020-05-28: qty 2

## 2020-05-28 MED ORDER — SODIUM CHLORIDE 0.9 % IV SOLN
500.0000 mg | INTRAVENOUS | Status: AC
  Administered 2020-05-28: 500 mg via INTRAVENOUS
  Filled 2020-05-28: qty 500

## 2020-05-28 MED ORDER — ALBUTEROL SULFATE (2.5 MG/3ML) 0.083% IN NEBU: 2.5000 mg | INHALATION_SOLUTION | RESPIRATORY_TRACT | Status: DC | PRN

## 2020-05-28 MED ORDER — THIAMINE HCL 100 MG PO TABS
50.0000 mg | ORAL_TABLET | Freq: Every day | ORAL | Status: DC
  Administered 2020-05-29 – 2020-05-31 (×3): 50 mg via ORAL
  Filled 2020-05-28 (×3): qty 1

## 2020-05-28 MED ORDER — LORATADINE 10 MG PO TABS
10.0000 mg | ORAL_TABLET | Freq: Every day | ORAL | Status: DC
  Administered 2020-05-29 – 2020-05-31 (×3): 10 mg via ORAL
  Filled 2020-05-28 (×4): qty 1

## 2020-05-28 MED ORDER — KETOROLAC TROMETHAMINE 30 MG/ML IJ SOLN: 30.0000 mg | Freq: Three times a day (TID) | INTRAMUSCULAR | Status: DC | PRN

## 2020-05-28 MED ORDER — ATORVASTATIN CALCIUM 20 MG PO TABS
40.0000 mg | ORAL_TABLET | Freq: Every day | ORAL | Status: DC
  Administered 2020-05-28 – 2020-05-30 (×3): 40 mg via ORAL
  Filled 2020-05-28 (×3): qty 2

## 2020-05-28 MED ORDER — VITAMIN D 25 MCG (1000 UNIT) PO TABS
1000.0000 [IU] | ORAL_TABLET | Freq: Every day | ORAL | Status: DC
  Administered 2020-05-29 – 2020-05-31 (×3): 1000 [IU] via ORAL
  Filled 2020-05-28 (×3): qty 1

## 2020-05-28 MED ORDER — ENOXAPARIN SODIUM 40 MG/0.4ML IJ SOSY
40.0000 mg | PREFILLED_SYRINGE | INTRAMUSCULAR | Status: DC
  Administered 2020-05-28: 40 mg via SUBCUTANEOUS
  Filled 2020-05-28 (×2): qty 0.4

## 2020-05-28 MED ORDER — ACETAMINOPHEN 325 MG PO TABS: 650.0000 mg | ORAL_TABLET | Freq: Four times a day (QID) | ORAL | Status: DC | PRN

## 2020-05-28 MED ORDER — METHYLPREDNISOLONE SODIUM SUCC 125 MG IJ SOLR
125.0000 mg | Freq: Once | INTRAMUSCULAR | Status: AC
  Administered 2020-05-28: 125 mg via INTRAVENOUS
  Filled 2020-05-28: qty 2

## 2020-05-28 MED ORDER — DOXYCYCLINE HYCLATE 100 MG PO TABS
100.0000 mg | ORAL_TABLET | Freq: Once | ORAL | Status: AC
  Administered 2020-05-28: 100 mg via ORAL
  Filled 2020-05-28: qty 1

## 2020-05-28 MED ORDER — ASCORBIC ACID 500 MG PO TABS
250.0000 mg | ORAL_TABLET | Freq: Every day | ORAL | Status: DC
  Administered 2020-05-29 – 2020-05-31 (×3): 250 mg via ORAL
  Filled 2020-05-28 (×3): qty 1

## 2020-05-28 MED ORDER — METOPROLOL SUCCINATE ER 25 MG PO TB24
12.5000 mg | ORAL_TABLET | Freq: Every day | ORAL | Status: DC
  Administered 2020-05-29 – 2020-05-31 (×3): 12.5 mg via ORAL
  Filled 2020-05-28 (×3): qty 1

## 2020-05-28 MED ORDER — ZINC SULFATE 220 (50 ZN) MG PO CAPS
220.0000 mg | ORAL_CAPSULE | Freq: Every day | ORAL | Status: DC
  Administered 2020-05-29 – 2020-05-31 (×3): 220 mg via ORAL
  Filled 2020-05-28 (×3): qty 1

## 2020-05-28 NOTE — ED Notes (Signed)
Report received from [place], [place]

## 2020-05-28 NOTE — ED Notes (Signed)
Patient up to bathroom with assistance from [place], [place].

## 2020-05-28 NOTE — ED Provider Notes (Addendum)
[facility] Emergency Department Provider Note ____________________________________________   Event Date/Time   First MD Initiated Contact with Patient 05/28/20 1010     (approximate)  I have reviewed the triage vital signs and the nursing notes.  HISTORY  Chief Complaint Chest Pain   HPI Terry Freeman is a 65 y.o. malewho presents to the ED for evaluation of chest pain.   Chart review indicates history of HTN, HLD, GERD and COPD. Follows with Dr. Clayborn Bigness, cardiology, for CAD.  PCI and stenting in 2012.  Patient presents to the ED due to acute shortness of breath when awakening this morning.  He reports feeling fine yesterday, but having a busy day walking outside a lot through the woods and reports a degree of concern for pollen and allergies. Denies recent antibiotics or steroids. Reports shortness of breath and chest tightness throughout the morning this morning, minimally relieved with his home albuterol inhaler, so he presents to the ED for evaluation.  Reports taking nitroglycerin at home without improvement of his symptoms. Further reports increased sputum production to his baseline smoker's cough over the past couple days.  Denies fevers.  Past Medical History:  Diagnosis Date  . Asthma   . Chronic kidney disease   . Collagen vascular disease ([place])   . COPD (chronic obstructive pulmonary disease) ([place])   . Coronary artery disease   . GERD (gastroesophageal reflux disease)   . Hypertension   . Myocardial infarction ([place])   . Pneumonia 1983  . Reflux     Patient Active Problem List   Diagnosis Date Noted  . Stage 3a chronic kidney disease ([place]) 05/28/2020  . Pneumothorax 05/28/2020  . COPD exacerbation ([place]) 05/28/2020  . Lung mass 08/18/2014  . Chest pain 06/22/2014  . Abnormal CT scan, chest 06/22/2014  . CAD (coronary artery disease) 06/22/2014  . Leucocytosis 06/22/2014  . HTN (hypertension) 06/22/2014  . COPD (chronic  obstructive pulmonary disease) ([place]) 06/22/2014  . CKD (chronic kidney disease) 06/22/2014  . HLD (hyperlipidemia) 06/22/2014  . CAFL (chronic airflow limitation) ([place]) 06/22/2014  . Atherosclerosis of coronary artery 06/22/2014  . Essential (primary) hypertension 06/22/2014    Past Surgical History:  Procedure Laterality Date  . APPENDECTOMY    . CORONARY ANGIOPLASTY WITH STENT PLACEMENT      Prior to Admission medications   Medication Sig Start Date End Date Taking? Authorizing Provider  amoxicillin-clavulanate (AUGMENTIN) 875-125 MG per tablet Take 1 tablet by mouth 2 (two) times daily. 09/01/14   Forest Gleason, MD  aspirin 81 MG tablet Take 81 mg by mouth daily.    [provider]  budesonide-formoterol (SYMBICORT) 160-4.5 MCG/ACT inhaler Inhale 2 puffs into the lungs 2 (two) times daily. 06/23/14   Aldean Jewett, MD  clopidogrel (PLAVIX) 75 MG tablet Take 1 tablet (75 mg total) by mouth daily. 06/23/14   Aldean Jewett, MD  metoprolol tartrate (LOPRESSOR) 25 MG tablet Take 0.5 tablets (12.5 mg total) by mouth daily. 06/23/14   Aldean Jewett, MD  mometasone-formoterol (DULERA) 100-5 MCG/ACT AERO Inhale 2 puffs into the lungs 2 (two) times daily.    [provider]  ranitidine (ZANTAC) 150 MG tablet Take 150 mg by mouth 2 (two) times daily.    [provider]  simvastatin (ZOCOR) 40 MG tablet Take 1 tablet (40 mg total) by mouth daily. 06/23/14   Aldean Jewett, MD    Allergies Patient has no known allergies.  Family History  Problem Relation Age of  Onset  . Cancer - Lung Mother   . CAD Mother   . Cancer - Lung Father     Social History Social History   Tobacco Use  . Smoking status: Former Smoker    Packs/day: 5.00    Years: 35.00    Pack years: 175.00    Quit date: 08/23/2004    Years since quitting: 15.7  . Smokeless tobacco: Never Used  Substance Use Topics  . Alcohol use: No  . Drug use: No    Comment: Quit smoking 10 years  ago...    Review of Systems  Constitutional: No fever/chills Eyes: No visual changes. ENT: No sore throat. Cardiovascular: Positive for chest pain. Respiratory: Positive for shortness of breath Gastrointestinal: No abdominal pain.  No nausea, no vomiting.  No diarrhea.  No constipation. Genitourinary: Negative for dysuria. Musculoskeletal: Negative for back pain. Skin: Negative for rash. Neurological: Negative for headaches, focal weakness or numbness.  ____________________________________________   PHYSICAL EXAM:  VITAL SIGNS: Vitals:   05/28/20 1130 05/28/20 1145  BP:  109/79  Pulse: 88 (!) 101  Resp: 12 19  Temp:    SpO2: 97% 90%     Constitutional: Alert and oriented.  Sitting upright in bed and appears uncomfortable.  Speaking in full sentences. Eyes: Conjunctivae are normal. PERRL. EOMI. Head: Atraumatic. Nose: No congestion/rhinnorhea. Mouth/Throat: Mucous membranes are moist.  Oropharynx non-erythematous. Neck: No stridor. No cervical spine tenderness to palpation.. Cardiovascular: Tachycardic rate, regular rhythm. Grossly normal heart sounds.  Good peripheral circulation. Respiratory: Slightly tachypneic to the low 20s.  Poor air movement throughout and prolonged expiratory phase.  Diffuse expiratory wheezes are faint Gastrointestinal: Soft , nondistended, nontender to palpation. No CVA tenderness. Musculoskeletal: No lower extremity tenderness nor edema.  No joint effusions. No signs of acute trauma. Neurologic:  Normal speech and language. No gross focal neurologic deficits are appreciated. No gait instability noted. Skin:  Skin is warm, dry and intact. No rash noted. Psychiatric: Mood and affect are normal. Speech and behavior are normal.  ____________________________________________   LABS (all labs ordered are listed, but only abnormal results are displayed)  Labs Reviewed  BASIC METABOLIC PANEL - Abnormal; Notable for the following components:       Result Value   Glucose, Bld 122 (*)    Creatinine, Ser 1.38 (*)    GFR, Estimated 58 (*)    All other components within normal limits  RESP PANEL BY RT-PCR (FLU A&B, COVID) ARPGX2  CBC  PROTIME-INR  TROPONIN I (HIGH SENSITIVITY)  TROPONIN I (HIGH SENSITIVITY)   ____________________________________________  12 Lead EKG  Sinus rhythm, rate of 92 bpm.  Normal axis and intervals.  No evidence of acute ischemia. ____________________________________________  RADIOLOGY  ED MD interpretation: 2 view CXR reviewed by me with left-sided pneumothorax Repeat 1 view CXR after pigtail chest tube placement demonstrates appropriate positioning and improved aeration of left lung fields  Official radiology report(s): DG Chest 2 View  Result Date: 05/28/2020 CLINICAL DATA:  Chest pain EXAM: CHEST - 2 VIEW COMPARISON:  Prior chest x-ray 08/24/2014 FINDINGS: Moderate to large left-sided pneumothorax likely secondary to rupture of a large bleb in the superolateral aspect of the chest seen on prior imaging. Tethering of the lateral mid lung to the pleural surface is noted. Cardiac and mediastinal contours are within normal limits. No evidence of right-sided pneumothorax. Advanced emphysematous and chronic bronchitic changes again noted. No acute osseous abnormality. IMPRESSION: Moderate to large left-sided pneumothorax likely secondary to rupture of a large  bleb. There is evidence of tethering of the lateral mid lung to the pleural surface. Advanced emphysema and chronic bronchitic changes. Electronically Signed   By: Jacqulynn Cadet M.D.   On: 05/28/2020 11:20   DG Chest Portable 1 View  Result Date: 05/28/2020 CLINICAL DATA:  Chest tube placement EXAM: PORTABLE CHEST 1 VIEW COMPARISON:  Same day chest radiograph FINDINGS: There has been interval placement of a pleural pigtail catheter on the left with a decreased left pneumothorax, now small in size. Additional blebs in bola are noted in both lungs. No  pleural effusion. No right-sided pneumothorax. The heart appears normal. IMPRESSION: Interval placement of a pleural pigtail catheter with decreased left pneumothorax, now small in size. Electronically Signed   By: Zerita Boers M.D.   On: 05/28/2020 13:02    ____________________________________________   PROCEDURES and INTERVENTIONS  Procedure(s) performed (including Critical Care):  .1-3 Lead EKG Interpretation Performed by: Vladimir Crofts, MD Authorized by: Vladimir Crofts, MD     Interpretation: abnormal     ECG rate:  101   ECG rate assessment: tachycardic     Rhythm: sinus tachycardia     Ectopy: none     Conduction: normal   .Critical Care Performed by: Vladimir Crofts, MD Authorized by: Vladimir Crofts, MD   Critical care provider statement:    Critical care time (minutes):  45   Critical care was necessary to treat or prevent imminent or life-threatening deterioration of the following conditions:  Respiratory failure   Critical care was time spent personally by me on the following activities:  Discussions with consultants, evaluation of patient's response to treatment, examination of patient, ordering and performing treatments and interventions, ordering and review of laboratory studies, ordering and review of radiographic studies, pulse oximetry, re-evaluation of patient's condition, obtaining history from patient or surrogate and review of old charts CHEST TUBE INSERTION  Date/Time: 05/28/2020 1:18 PM Performed by: Vladimir Crofts, MD Authorized by: Vladimir Crofts, MD   Consent:    Consent obtained:  Verbal   Consent given by:  Patient and spouse   Risks, benefits, and alternatives were discussed: yes     Risks discussed:  Bleeding, damage to surrounding structures, incomplete drainage, infection and pain   Alternatives discussed:  No treatment Pre-procedure details:    Skin preparation:  Chlorhexidine   Preparation: Patient was prepped and draped in the usual sterile fashion    Sedation:    Sedation type:  Anxiolysis (Fentanyl) Anesthesia:    Anesthesia method:  Local infiltration   Local anesthetic:  Lidocaine 1% WITH epi Procedure details:    Placement location: Left anterior axillary line at T5 interspace.   Tube size [place]): 14 French pigtail chest tube.   Ultrasound guidance: yes     Tension pneumothorax: no     Tube connected to:  Suction   Drainage characteristics:  Air only   Suture material:  0 silk   Dressing:  4x4 sterile gauze Post-procedure details:    Post-insertion x-ray findings: tube in good position     Procedure completion:  Tolerated well, no immediate complications Comments:     Bedside ultrasound utilized to ensure appropriate positioning considering superior tethering of his lungs apparent on CXR.  Seldinger technique was utilized to place 14 French pigtail chest tube into left-sided pleural space at the T5 interspace to the anterior axillary line.  Well-tolerated without apparent complication.  Follow-up CXR is reassuring.    Medications  acetaminophen (TYLENOL) tablet 1,000 mg (has no administration in time  range)  oxyCODONE (Oxy IR/ROXICODONE) immediate release tablet 5 mg (has no administration in time range)  methylPREDNISolone sodium succinate (SOLU-MEDROL) 125 mg/2 mL injection 125 mg (125 mg Intravenous Given 05/28/20 1119)  doxycycline (VIBRA-TABS) tablet 100 mg (100 mg Oral Given 05/28/20 1118)  ipratropium-albuterol (DUONEB) 0.5-2.5 (3) MG/3ML nebulizer solution 6 mL (6 mLs Nebulization Given 05/28/20 1121)  lidocaine-EPINEPHrine (XYLOCAINE W/EPI) 2 %-1:100000 (with pres) injection 20 mL (20 mLs Infiltration Given by Other 05/28/20 1220)  fentaNYL (SUBLIMAZE) injection 50 mcg (50 mcg Intravenous Given 05/28/20 1216)    ____________________________________________   MDM / ED COURSE   65 year old male with history of COPD presents to the ED short of breath with chest discomfort, with evidence of COPD exacerbation and  spontaneous pneumothorax, requiring pigtail chest tube placement and medical admission.  Presents tachycardic with sats in the low 90s, but remains hemodynamically stable throughout.  Exam with stigmata of COPD exacerbation without evidence of distress, neurologic or vascular deficits.  Blood work is benign.  EKG is nonischemic and he has no evidence of ACS.  CXR demonstrates no infiltrates to suggest CAP, but does demonstrate PTX, likely due to bleb.  Improving symptoms while waiting for this chest x-ray with steroids, breathing treatment.  Provided single dose of doxycycline due to increased sputum production and initial anticipation of outpatient management.  After CXR findings are reviewed with the patient, he is agreeable with bedside pigtail chest tube placement, which was placed without apparent complication.  Remains to low continuous suction.  We will discuss the case with hospitalist for admission.  Clinical Course as of 05/28/20 1344  Sun May 28, 2020  1155 Educated patient and wife on diagnosis of pneumothorax.  We discussed pigtail chest tube, they are agreeable. [DS]  1308 Pigtail placed, uncomplicated [DS]  6578 Spoke with Dr. Damita Dunnings, who agrees to admit [DS]    Clinical Course User Index [DS] Vladimir Crofts, MD    ____________________________________________   FINAL CLINICAL IMPRESSION(S) / ED DIAGNOSES  Final diagnoses:  COPD exacerbation ([place])  Primary spontaneous pneumothorax     ED Discharge Orders    None       Mesha Schamberger Tamala Julian   Note:  This document was prepared using Dragon voice recognition software and may include unintentional dictation errors.   Vladimir Crofts, MD 05/28/20 1322    Vladimir Crofts, MD 05/28/20 1344

## 2020-05-28 NOTE — H&P (Signed)
History and Physical    Terry Freeman KDT:[id number] DOB: 06/15/1955 DOA: 05/28/2020  PCP: Bunnie Pion, FNP   Patient coming from: Home  I have personally briefly reviewed patient's old medical records in [place]  Chief Complaint: Shortness of breath, chest pain  HPI: Terry Freeman is a 65 y.o. male with medical history significant for CAD with history of MI and stent in 2012, COPD, HTN, HLD, GERD, who presents with shortness of breath on awakening associated with chest tightness.  Reports using his albuterol inhaler with minimal improvement and then took a nitroglycerin also with minimal improvement.  Patient reports spending all day outdoors the day prior and had allergy symptoms of sinus congestion but otherwise felt fine when he went to bed.  Denies fever or chills.  Describes his chest tightness as retrosternal, worse on coughing, nonradiating of moderate intensity. ED course: On arrival, afebrile, BP 109/79 with pulse 88, respirations 12 with O2 sat 97% on room air.  Blood work unremarkable, with troponin 4>6.  Creatinine 1.34 which is his baseline EKG, personally reviewed and interpreted: Normal sinus rhythm at 92 with no acute ST-T wave changes Imaging: Chest x-ray moderate to large left-sided pneumothorax likely secondary to rupture of a large bleb and advanced emphysema  Patient received DuoNeb in the ER, underwent placement of pigtail catheter in the emergency room with repeat chest x-ray showing decrease in left pneumothorax, now small in size  Hospitalist consulted for admission  Review of Systems: As per HPI otherwise all other systems on review of systems negative.    Past Medical History:  Diagnosis Date  . Asthma   . Chronic kidney disease   . Collagen vascular disease ([place])   . COPD (chronic obstructive pulmonary disease) ([place])   . Coronary artery disease   . GERD (gastroesophageal reflux disease)   . Hypertension   . Myocardial infarction ([place])    . Pneumonia 1983  . Reflux     Past Surgical History:  Procedure Laterality Date  . APPENDECTOMY    . CORONARY ANGIOPLASTY WITH STENT PLACEMENT       reports that he quit smoking about 15 years ago. He has a 175.00 pack-year smoking history. He has never used smokeless tobacco. He reports that he does not drink alcohol and does not use drugs.  No Known Allergies  Family History  Problem Relation Age of Onset  . Cancer - Lung Mother   . CAD Mother   . Cancer - Lung Father       Prior to Admission medications   Medication Sig Start Date End Date Taking? Authorizing Provider  amoxicillin-clavulanate (AUGMENTIN) 875-125 MG per tablet Take 1 tablet by mouth 2 (two) times daily. 09/01/14   Forest Gleason, MD  aspirin 81 MG tablet Take 81 mg by mouth daily.    [provider]  budesonide-formoterol (SYMBICORT) 160-4.5 MCG/ACT inhaler Inhale 2 puffs into the lungs 2 (two) times daily. 06/23/14   Aldean Jewett, MD  clopidogrel (PLAVIX) 75 MG tablet Take 1 tablet (75 mg total) by mouth daily. 06/23/14   Aldean Jewett, MD  metoprolol tartrate (LOPRESSOR) 25 MG tablet Take 0.5 tablets (12.5 mg total) by mouth daily. 06/23/14   Aldean Jewett, MD  mometasone-formoterol (DULERA) 100-5 MCG/ACT AERO Inhale 2 puffs into the lungs 2 (two) times daily.    [provider]  ranitidine (ZANTAC) 150 MG tablet Take 150 mg by mouth 2 (two) times daily.    [provider]  simvastatin (ZOCOR) 40 MG tablet Take 1 tablet (40 mg total) by mouth daily. 06/23/14   Aldean Jewett, MD    Physical Exam: Vitals:   05/28/20 1019 05/28/20 1030 05/28/20 1130 05/28/20 1145  BP: (!) 143/94 120/80  109/79  Pulse: (!) 102 89 88 (!) 101  Resp: 20 15 12 19   Temp: 98.1 F (36.7 C)     TempSrc: Oral     SpO2: 96% 93% 97% 90%  Weight:      Height:         Vitals:   05/28/20 1019 05/28/20 1030 05/28/20 1130 05/28/20 1145  BP: (!) 143/94 120/80  109/79  Pulse: (!) 102 89 88  (!) 101  Resp: 20 15 12 19   Temp: 98.1 F (36.7 C)     TempSrc: Oral     SpO2: 96% 93% 97% 90%  Weight:      Height:          Constitutional: Alert and oriented x 3 . Not in any apparent distress HEENT:      Head: Normocephalic and atraumatic.         Eyes: PERLA, EOMI, Conjunctivae are normal. Sclera is non-icteric.       Mouth/Throat: Mucous membranes are moist.       Neck: Supple with no signs of meningismus. Cardiovascular: Regular rate and rhythm. No murmurs, gallops, or rubs. 2+ symmetrical distal pulses are present . No JVD. No LE edema Respiratory: Respiratory effort normal .Lungs sounds diminished bilaterally. No wheezes, crackles, or rhonchi. Pigtail catheter lower lefty anterior chest wall. Gastrointestinal: Soft, non tender, and non distended with positive bowel sounds.  Genitourinary: No CVA tenderness. Musculoskeletal: Nontender with normal range of motion in all extremities. No cyanosis, or erythema of extremities. Neurologic:  Face is symmetric. Moving all extremities. No gross focal neurologic deficits . Skin: Skin is warm, dry.  No rash or ulcers Psychiatric: Mood and affect are normal    Labs on Admission: I have personally reviewed following labs and imaging studies  CBC: Recent Labs  Lab 05/28/20 1023  WBC 8.5  HGB 15.1  HCT 44.3  MCV 84.9  PLT 295   Basic Metabolic Panel: Recent Labs  Lab 05/28/20 1023  NA 138  K 3.9  CL 103  CO2 25  GLUCOSE 122*  BUN 11  CREATININE 1.38*  CALCIUM 9.2   GFR: Estimated Creatinine Clearance: 73.6 mL/min (A) (by C-G formula based on SCr of 1.38 mg/dL (H)). Liver Function Tests: No results for input(s): AST, ALT, ALKPHOS, BILITOT, PROT, ALBUMIN in the last 168 hours. No results for input(s): LIPASE, AMYLASE in the last 168 hours. No results for input(s): AMMONIA in the last 168 hours. Coagulation Profile: Recent Labs  Lab 05/28/20 1023  INR 1.0   Cardiac Enzymes: No results for input(s): CKTOTAL,  CKMB, CKMBINDEX, TROPONINI in the last 168 hours. BNP (last 3 results) No results for input(s): PROBNP in the last 8760 hours. HbA1C: No results for input(s): HGBA1C in the last 72 hours. CBG: No results for input(s): GLUCAP in the last 168 hours. Lipid Profile: No results for input(s): CHOL, HDL, LDLCALC, TRIG, CHOLHDL, LDLDIRECT in the last 72 hours. Thyroid Function Tests: No results for input(s): TSH, T4TOTAL, FREET4, T3FREE, THYROIDAB in the last 72 hours. Anemia Panel: No results for input(s): VITAMINB12, FOLATE, FERRITIN, TIBC, IRON, RETICCTPCT in the last 72 hours. Urine analysis: No results found for: COLORURINE, APPEARANCEUR, [place], [place], [place], [place], [place], [place], [place], [place], NITRITE, LEUKOCYTESUR  Radiological Exams on Admission: DG Chest 2 View  Result Date: 05/28/2020 CLINICAL DATA:  Chest pain EXAM: CHEST - 2 VIEW COMPARISON:  Prior chest x-ray 08/24/2014 FINDINGS: Moderate to large left-sided pneumothorax likely secondary to rupture of a large bleb in the superolateral aspect of the chest seen on prior imaging. Tethering of the lateral mid lung to the pleural surface is noted. Cardiac and mediastinal contours are within normal limits. No evidence of right-sided pneumothorax. Advanced emphysematous and chronic bronchitic changes again noted. No acute osseous abnormality. IMPRESSION: Moderate to large left-sided pneumothorax likely secondary to rupture of a large bleb. There is evidence of tethering of the lateral mid lung to the pleural surface. Advanced emphysema and chronic bronchitic changes. Electronically Signed   By: Jacqulynn Cadet M.D.   On: 05/28/2020 11:20   DG Chest Portable 1 View  Result Date: 05/28/2020 CLINICAL DATA:  Chest tube placement EXAM: PORTABLE CHEST 1 VIEW COMPARISON:  Same day chest radiograph FINDINGS: There has been interval placement of a pleural pigtail catheter on the left with a decreased left pneumothorax, now  small in size. Additional blebs in bola are noted in both lungs. No pleural effusion. No right-sided pneumothorax. The heart appears normal. IMPRESSION: Interval placement of a pleural pigtail catheter with decreased left pneumothorax, now small in size. Electronically Signed   By: Zerita Boers M.D.   On: 05/28/2020 13:02     Assessment/Plan 65 year old male with history of CAD with history of MI and stent in 2012, COPD, HTN, HLD, GERD, presenting with dyspnea and chest tightness.  Reports using his albuterol inhaler with minimal improvement and then took a nitroglycerin also with minimal improvement .   Pneumothorax, left - Patient presents with shortness of breath and chest pain unrelieved with albuterol inhaler and nitroglycerin with chest x-ray showing moderate to large left pneumothorax, likely related to a ruptured large bleb - Status post left pleural pigtail catheter with decreased left pneumothorax now small in size - Surgical consult to follow  COPD exacerbation, mild,  - Patient received duo nebs in the emergency room - Continue with duo nebs as needed - Azithromycin - Oral prednisone    CAD , with history of MI and stents - Patient had chest pain but with 2 negative troponins and EKG nonacute - ACS not suspected.  Chest pain likely related to pneumothorax - Continue home metoprolol, ezetimibe    HTN (hypertension) - BP borderline low - Continue home metoprolol    Stage 3a chronic kidney disease (HCC) - Creatinine 1.38, at baseline - Monitor    DVT prophylaxis: Lovenox  Code Status: full code  Family Communication:  none  Disposition Plan: Back to previous home environment Consults called: surgery Status: Observation    Athena Masse MD [facility]     05/28/2020, 1:46 PM

## 2020-05-28 NOTE — Consult Note (Signed)
Patient ID: Terry Freeman, male   DOB: [birth year], 65 y.o.   MRN: [id number]  HPI Terry Freeman is a 65 y.o. male seen in consultation at the request of Dr. Damita Dunnings for management of left chest pain.  He has a history of CAD and prior MI with a stent placement.  Also has a history of COPD hypertension.  He presents this morning with chest tightness/pain in the left side that started this morning associated with shortness of breath.  He does take inhalers for his COPD but this did not improve. He denies any fevers and chills.  He denies any history of trauma.  Chest x-ray on arrival to the ER revealed a moderate pneumothorax in the left side.  Dr. Tamala Julian placed a pigtail with good response but there is still an apical pneumothorax in the left side.  He is noted to have personal review the x-ray.  Additionally he did have a CT of the chest 5 months ago showing some small hiatal hernia and gallstones.  Evidence of emphysema and multiple blebs bilaterally. CBC was completely normal BMP shows chronic kidney disease with a baseline creatinine of 1.38 rest of the labs are normal  HPI  Past Medical History:  Diagnosis Date  . Asthma   . Chronic kidney disease   . Collagen vascular disease ([place])   . COPD (chronic obstructive pulmonary disease) ([place])   . Coronary artery disease   . GERD (gastroesophageal reflux disease)   . Hypertension   . Myocardial infarction ([place])   . Pneumonia 1983  . Reflux     Past Surgical History:  Procedure Laterality Date  . APPENDECTOMY    . CORONARY ANGIOPLASTY WITH STENT PLACEMENT      Family History  Problem Relation Age of Onset  . Cancer - Lung Mother   . CAD Mother   . Cancer - Lung Father     Social History Social History   Tobacco Use  . Smoking status: Former Smoker    Packs/day: 5.00    Years: 35.00    Pack years: 175.00    Quit date: 08/23/2004    Years since quitting: 15.7  . Smokeless tobacco: Never Used  Substance Use Topics  . Alcohol  use: No  . Drug use: No    Comment: Quit smoking 10 years ago...    No Known Allergies  Current Facility-Administered Medications  Medication Dose Route Frequency Provider Last Rate Last Admin  . acetaminophen (TYLENOL) tablet 1,000 mg  1,000 mg Oral Once Vladimir Crofts, MD      . albuterol (PROVENTIL) (2.5 MG/3ML) 0.083% nebulizer solution 2.5 mg  2.5 mg Nebulization Q2H PRN Athena Masse, MD      . Derrill Memo ON 05/29/2020] ascorbic acid (VITAMIN C) tablet 250 mg  250 mg Oral Daily Athena Masse, MD      . Derrill Memo ON 05/29/2020] aspirin EC tablet 81 mg  81 mg Oral Daily Judd Gaudier V, MD      . atorvastatin (LIPITOR) tablet 40 mg  40 mg Oral QHS Judd Gaudier V, MD      . azithromycin (ZITHROMAX) 500 mg in sodium chloride 0.9 % 250 mL IVPB  500 mg Intravenous Q24H Athena Masse, MD       Followed by  . [START ON 05/29/2020] azithromycin (ZITHROMAX) tablet 500 mg  500 mg Oral Daily Athena Masse, MD      . Derrill Memo ON 05/29/2020] cholecalciferol (VITAMIN D3) tablet 1,000 Units  1,000  Units Oral Daily Judd Gaudier V, MD      . enoxaparin (LOVENOX) injection 40 mg  40 mg Subcutaneous Q24H Athena Masse, MD      . Derrill Memo ON 05/29/2020] loratadine (CLARITIN) tablet 10 mg  10 mg Oral Daily Athena Masse, MD      . Derrill Memo ON 05/29/2020] metoprolol succinate (TOPROL-XL) 24 hr tablet 12.5 mg  12.5 mg Oral Daily Judd Gaudier V, MD      . oxyCODONE (Oxy IR/ROXICODONE) immediate release tablet 5 mg  5 mg Oral Once Vladimir Crofts, MD      . Derrill Memo ON 05/29/2020] thiamine tablet 50 mg  50 mg Oral Daily Athena Masse, MD      . Derrill Memo ON 05/29/2020] zinc sulfate capsule 220 mg  220 mg Oral Daily Athena Masse, MD       Current Outpatient Medications  Medication Sig Dispense Refill  . aspirin 325 MG tablet Take 325 mg by mouth daily.    Marland Kitchen aspirin 81 MG tablet Take 81 mg by mouth daily.    Marland Kitchen atorvastatin (LIPITOR) 40 MG tablet Take 40 mg by mouth at bedtime.    . cetirizine (ZYRTEC) 10 MG tablet Take 10 mg  by mouth daily as needed.    . cholecalciferol (VITAMIN D3) 25 MCG (1000 UNIT) tablet Take 1,000 Units by mouth daily.    . fluticasone (FLONASE) 50 MCG/ACT nasal spray Place 1-2 sprays into both nostrils daily.    . metoprolol succinate (TOPROL-XL) 25 MG 24 hr tablet Take 12.5 mg by mouth daily.    . Multiple Vitamin (MULTIVITAMIN WITH MINERALS) TABS tablet Take 1 tablet by mouth daily.    . naphazoline-pheniramine (VISINE) 0.025-0.3 % ophthalmic solution 1 drop 4 (four) times daily as needed for eye irritation.    Marland Kitchen omeprazole (PRILOSEC) 20 MG capsule Take 20 mg by mouth daily.    Marland Kitchen PROAIR HFA 108 (90 Base) MCG/ACT inhaler Inhale 2 puffs into the lungs every 4 (four) hours as needed.    . thiamine (VITAMIN B-1) 50 MG tablet Take 50 mg by mouth daily.    . vitamin C (ASCORBIC ACID) 250 MG tablet Take 250 mg by mouth daily.    Marland Kitchen zinc gluconate 50 MG tablet Take 50 mg by mouth daily.    Marland Kitchen amoxicillin-clavulanate (AUGMENTIN) 875-125 MG per tablet Take 1 tablet by mouth 2 (two) times daily. (Patient not taking: No sig reported) 20 tablet 0  . budesonide-formoterol (SYMBICORT) 160-4.5 MCG/ACT inhaler Inhale 2 puffs into the lungs 2 (two) times daily. (Patient not taking: No sig reported) 1 Inhaler 12  . clopidogrel (PLAVIX) 75 MG tablet Take 1 tablet (75 mg total) by mouth daily. (Patient not taking: No sig reported) 30 tablet 3  . metoprolol tartrate (LOPRESSOR) 25 MG tablet Take 0.5 tablets (12.5 mg total) by mouth daily. (Patient not taking: No sig reported) 30 tablet 3  . mometasone-formoterol (DULERA) 100-5 MCG/ACT AERO Inhale 2 puffs into the lungs 2 (two) times daily. (Patient not taking: No sig reported)    . ranitidine (ZANTAC) 150 MG tablet Take 150 mg by mouth daily. (Patient not taking: No sig reported)    . simvastatin (ZOCOR) 40 MG tablet Take 1 tablet (40 mg total) by mouth daily. (Patient not taking: No sig reported) 30 tablet 3     Review of Systems Full ROS  was asked and was  negative except for the information on the HPI  Physical Exam Blood pressure 109/79, pulse Marland Kitchen)  101, temperature 98.1 F (36.7 C), temperature source Oral, resp. rate 19, height 6\' 3"  (1.905 m), weight 107.5 kg, SpO2 90 %. CONSTITUTIONAL: NAD. EYES: Pupils are equal, round, Sclera are non-icteric. EARS, NOSE, MOUTH AND THROAT:  Hearing is intact to voice. LYMPH NODES:  Lymph nodes in the neck are normal. RESPIRATORY:  Lungs are clear. There is normal respiratory effort, with equal breath sounds bilaterally, and without pathologic use of accessory muscles. Does have a well-positioned chest tube on the left side.  There is no evidence of subcutaneous emphysema and there is no evidence of air leak CARDIOVASCULAR: Heart is regular without murmurs, gallops, or rubs. GI: The abdomen is  soft, nontender, and nondistended. There are no palpable masses. There is no hepatosplenomegaly. There are normal bowel sounds in all quadrants. GU: Rectal deferred.   MUSCULOSKELETAL: Normal muscle strength and tone. No cyanosis or edema.   SKIN: Turgor is good and there are no pathologic skin lesions or ulcers. NEUROLOGIC: Motor and sensation is grossly normal. Cranial nerves are grossly intact. PSYCH:  Oriented to person, place and time. Affect is normal.  Data Reviewed  I have personally reviewed the patient's imaging, laboratory findings and medical records.    Assessment/Plan 65 year old pneumothorax in the left side likely from a bleb rupture responded well to thoracostomy tube.  We will do at least  48 hours of suction since There is still a remaining apical pneumo.  No evidence of air leak.  We will obtain a chest x-ray tomorrow.  If the pneumothorax persist with prior perform a CT scan. If There is a persistent air leak or pneumothorax may need VATS with blebectomy and releases No need for emergent surgical intervention.  Caroleen Hamman, MD FACS General Surgeon 05/28/2020, 2:56 PM

## 2020-05-28 NOTE — ED Notes (Signed)
Pt taken for xray

## 2020-05-28 NOTE — ED Triage Notes (Signed)
Pt woke up this AM with [facility]- pt states that he wears a CPAP at night- pt states that he then started having centralized chest pressure- pt took 4 324mg  ASA instead of 4 81mg  ASA- pt was given 1 SL nitro by EMS and pt states that the chest pressure is gone now- pt has a hx of a stent in 2012 and sees Dr Clayborn Bigness

## 2020-05-28 NOTE — ED Notes (Signed)
Dr. Ismeal Heider at bedside.

## 2020-05-29 ENCOUNTER — Observation Stay: Payer: Self-pay

## 2020-05-29 ENCOUNTER — Inpatient Hospital Stay: Payer: Self-pay

## 2020-05-29 DIAGNOSIS — J9311 Primary spontaneous pneumothorax: Principal | ICD-10-CM

## 2020-05-29 LAB — GLUCOSE, CAPILLARY
Glucose-Capillary: 94 mg/dL (ref 70–99)
Glucose-Capillary: 142 mg/dL — ABNORMAL HIGH (ref 70–99)

## 2020-05-29 LAB — HIV ANTIBODY (ROUTINE TESTING W REFLEX): HIV Screen 4th Generation wRfx: NONREACTIVE

## 2020-05-29 NOTE — Progress Notes (Signed)
PT Cancellation Note  Patient Details Name: Terry Freeman MRN: [id number] DOB: [birth year]   Cancelled Treatment:    Reason Eval/Treat Not Completed: PT screened, no needs identified, will sign off: Per OT the patient was Ind with all functional tasks including amb with no AD with no skilled PT needs at this time.  Spoke to patient and pt agrees that he is at his functional baseline with no PT needs at this time.  Will complete PT orders at this time but will reassess pt pending a change in status upon receipt of new PT orders.    Linus Salmons PT, DPT 05/29/20, 10:37 AM

## 2020-05-29 NOTE — Progress Notes (Signed)
[facility] SURGICAL PROGRESS NOTE (cpt [phone])  Hospital Day(s): 0.   Interval History: Patient seen and examined, no acute events or new complaints overnight. Patient reports he is feeling much better this morning. He denies fever, chills, cough, CP, or SOB. CXR this morning with improved pneumothorax, still small residual present. He remains without gross air leak on pleurovac. No chest tube output recorded. No there complaints.    Review of Systems:  Constitutional: denies fever, chills  HEENT: denies cough or congestion  Respiratory: denies any shortness of breath  Cardiovascular: denies chest pain or palpitations  Gastrointestinal: denies abdominal pain, N/V, or diarrhea/and bowel function as per interval history Genitourinary: denies burning with urination or urinary frequency  Vital signs in last 24 hours: [min-max] current  Temp:  [97.5 F (36.4 C)-98.1 F (36.7 C)] 97.5 F (36.4 C) (05/09 0428) Pulse Rate:  [67-102] 67 (05/09 0428) Resp:  [12-20] 20 (05/09 0428) BP: (109-143)/(65-94) 121/79 (05/09 0428) SpO2:  [90 %-97 %] 96 % (05/09 0428) Weight:  [107.5 kg] 107.5 kg (05/08 1016)     Height: 6\' 3"  (190.5 cm) Weight: 107.5 kg BMI (Calculated): 29.62   Intake/Output last 2 shifts:  No intake/output data recorded.   Physical Exam:  Constitutional: alert, cooperative and no distress  HENT: normocephalic without obvious abnormality  Eyes: PERRL, EOM's grossly intact and symmetric  Respiratory: breathing non-labored at rest; CTAB, on [place] Cardiovascular: regular rate and sinus rhythm  Chest: Chest tube to the left lateral chest wall; site is CDI; no appreciable air leak; no output Musculoskeletal: no edema or wounds, motor and sensation grossly intact, NT    Labs:  CBC Latest Ref Rng & Units 05/28/2020 12/01/2016 08/24/2014  WBC 4.0 - 10.5 K/uL 8.5 10.5 11.8(H)  Hemoglobin 13.0 - 17.0 g/dL 15.1 15.8 14.1  Hematocrit 39.0 - 52.0 % 44.3 47.5 43.5  Platelets  150 - 400 K/uL 240 286 333   CMP Latest Ref Rng & Units 05/28/2020 12/01/2016 06/22/2014  Glucose 70 - 99 mg/dL 122(H) 104(H) 114(H)  BUN 8 - 23 mg/dL 11 QUANTITY NOT SUFFICIENT, UNABLE TO PERFORM TEST 17  Creatinine 0.61 - 1.24 mg/dL 1.38(H) QUANTITY NOT SUFFICIENT, UNABLE TO PERFORM TEST 1.31(H)  Sodium 135 - 145 mmol/L 138 139 137  Potassium 3.5 - 5.1 mmol/L 3.9 4.1 3.6  Chloride 98 - 111 mmol/L 103 107 103  CO2 22 - 32 mmol/L 25 15(L) 27  Calcium 8.9 - 10.3 mg/dL 9.2 9.2 8.7(L)  Total Protein 6.5 - 8.1 g/dL - - 7.1  Total Bilirubin 0.3 - 1.2 mg/dL - - 1.3(H)  Alkaline Phos 38 - 126 U/L - - 63  AST 15 - 41 U/L - - 28  ALT 17 - 63 U/L - - 30     Imaging studies:   CXR (05/29/2020) personally reviewed which appears improved from initially, ? Still small PTX, blebs seen, chest tube in place, and radiologist report reviewed:  IMPRESSION: 1. Stable left chest tube. 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 2. No new cardiopulmonary abnormality.   Assessment/Plan: (ICD-10's: J93.11) 65 y.o. male with improvement in left sided spontaneous pneumothorax s/p chest tube placement without air leak, complicated by pertinent comorbidities including significant history of tobacco abuse.   - Continue chest tube to -20 cm suction today; monitor output; monitor for air leak  - Repeat CXR in the AM  - If no significant improvement tomorrow; or develops air leak; would  obtain CT Chest   - No need for emergent surgical intervention; however, he understands that if he should fail to improve, then he would likely need VATS and Blebectomy. This would require transfer to [facility] as we are without thoracic surgery services here   - Okay to mobilize off suction   - Pain control prn; antiemetics prn  - Further management per primary services; we will follow    All of the above findings and recommendations were discussed with  the patient, patient's family (wife at bedside), and the medical team, and all of patient's and family's questions were answered to their expressed satisfaction.   -- Edison Simon, PA-C  [facility] 05/29/2020, 7:27 AM [phone] M-F: 7am - 4pm

## 2020-05-29 NOTE — Plan of Care (Signed)

## 2020-05-29 NOTE — Progress Notes (Signed)
PROGRESS NOTE    Terry Freeman  FVC:[id number] DOB: [birth year] DOA: 05/28/2020 PCP: Bunnie Pion, FNP   Brief Narrative: Taken from H&P. Terry Freeman is a 65 y.o. male with medical history significant for CAD with history of MI and stent in 2012, COPD, HTN, HLD, GERD, who presents with shortness of breath on awakening associated with chest tightness.  Reports using his albuterol inhaler with minimal improvement and then took a nitroglycerin also with minimal improvement.  Patient reports spending all day outdoors the day prior and had allergy symptoms of sinus congestion but otherwise felt fine when he went to bed.   On arrival he was hemodynamically stable, chest x-ray with moderate to large left-sided pneumothorax likely secondary to rupture of a large bleb with advanced emphysema.  General surgery was consulted and he was placed a pigtail catheter, CT chest today with 10% residual pneumothorax.  Surgery is planning to put catheter under waterseal tomorrow.  Subjective: Patient was seen and examined today.  Denies any complaint.  Having a lot of corrections about his new orthotics, stating that it just never happened before.  Used to be a heavy smoker but quit 15 years ago.  Wife at bedside.  Questions were answered to the best of my ability.  Assessment & Plan:   Principal Problem:   COPD exacerbation ([place]) Active Problems:   CAD (coronary artery disease)   HTN (hypertension)   Stage 3a chronic kidney disease (HCC)   Pneumothorax  Left pneumothorax.  Clinically seems improving, CT chest today with 10% residual pneumothorax.  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 General surgery is following-appreciate their recommendations. -Continue with left pleural pigtail catheter-they might put under waterseal tomorrow.  Concern of COPD exacerbation.  No wheezing today. -Continue with DuoNeb. -Continue with prednisone -Continue with Zithromax.  CAD , with  history of MI and stents - Patient had chest pain but with 2 negative troponins and EKG nonacute - ACS not suspected.  Chest pain likely related to pneumothorax - Continue home metoprolol, ezetimibe    HTN (hypertension).  Blood pressure within goal. - Continue home metoprolol    Stage 3a chronic kidney disease (HCC) - Creatinine at baseline - Monitor renal function. -Avoid nephrotoxins  Objective: Vitals:   05/28/20 1846 05/28/20 1932 05/29/20 0428 05/29/20 0848  BP: 113/80 113/77 121/79 123/64  Pulse: 92 (!) 101 67 65  Resp: 16 19 20 17   Temp: 97.6 F (36.4 C) 97.6 F (36.4 C) (!) 97.5 F (36.4 C) 97.6 F (36.4 C)  TempSrc: Oral Oral Oral   SpO2: 95% 95% 96% 96%  Weight:      Height:        Intake/Output Summary (Last 24 hours) at 05/29/2020 1415 Last data filed at 05/29/2020 1413 Gross per 24 hour  Intake 120 ml  Output 0 ml  Net 120 ml   Filed Weights   05/28/20 1016  Weight: 107.5 kg    Examination:  General exam: Appears calm and comfortable  Respiratory system: Clear to auscultation. Respiratory effort normal.  Left pigtail catheter. Cardiovascular system: S1 & S2 heard, RRR. No JVD, murmurs, rubs, gallops or clicks. Gastrointestinal system: Soft, nontender, nondistended, bowel sounds positive. Central nervous system: Alert and oriented. No focal neurological deficits.Symmetric 5 x 5 power. Extremities: No edema, no cyanosis, pulses intact and symmetrical. Psychiatry: Judgement and insight appear normal. Mood & affect appropriate.    DVT prophylaxis: SCDs Code Status: Full Family Communication: Wife was updated at bedside  Disposition Plan:  Status is: Inpatient  Remains inpatient appropriate because:Inpatient level of care appropriate due to severity of illness   Dispo: The patient is from: Home              Anticipated d/c is to: Home              Patient currently is not medically stable to d/c.   Difficult to place patient No                Level of care: Med-Surg  All the records are reviewed and case discussed with Care Management/Social Worker. Management plans discussed with the patient, nursing and they are in agreement.  Consultants:   General surgery  Procedures:  Chest tube placement  Antimicrobials:   Data Reviewed: I have personally reviewed following labs and imaging studies  CBC: Recent Labs  Lab 05/28/20 1023  WBC 8.5  HGB 15.1  HCT 44.3  MCV 84.9  PLT 026   Basic Metabolic Panel: Recent Labs  Lab 05/28/20 1023  NA 138  K 3.9  CL 103  CO2 25  GLUCOSE 122*  BUN 11  CREATININE 1.38*  CALCIUM 9.2   GFR: Estimated Creatinine Clearance: 73.6 mL/min (A) (by C-G formula based on SCr of 1.38 mg/dL (H)). Liver Function Tests: No results for input(s): AST, ALT, ALKPHOS, BILITOT, PROT, ALBUMIN in the last 168 hours. No results for input(s): LIPASE, AMYLASE in the last 168 hours. No results for input(s): AMMONIA in the last 168 hours. Coagulation Profile: Recent Labs  Lab 05/28/20 1023  INR 1.0   Cardiac Enzymes: No results for input(s): CKTOTAL, CKMB, CKMBINDEX, TROPONINI in the last 168 hours. BNP (last 3 results) No results for input(s): PROBNP in the last 8760 hours. HbA1C: No results for input(s): HGBA1C in the last 72 hours. CBG: No results for input(s): GLUCAP in the last 168 hours. Lipid Profile: No results for input(s): CHOL, HDL, LDLCALC, TRIG, CHOLHDL, LDLDIRECT in the last 72 hours. Thyroid Function Tests: No results for input(s): TSH, T4TOTAL, FREET4, T3FREE, THYROIDAB in the last 72 hours. Anemia Panel: No results for input(s): VITAMINB12, FOLATE, FERRITIN, TIBC, IRON, RETICCTPCT in the last 72 hours. Sepsis Labs: No results for input(s): PROCALCITON, LATICACIDVEN in the last 168 hours.  Recent Results (from the past 240 hour(s))  Resp Panel by RT-PCR (Flu A&B, Covid) Nasopharyngeal Swab     Status: None   Collection Time: 05/28/20  1:13 PM   Specimen: Nasopharyngeal  Swab; Nasopharyngeal(NP) swabs in vial transport medium  Result Value Ref Range Status   SARS Coronavirus 2 by RT PCR NEGATIVE NEGATIVE Final    Comment: (NOTE) SARS-CoV-2 target nucleic acids are NOT DETECTED.  The SARS-CoV-2 RNA is generally detectable in upper respiratory specimens during the acute phase of infection. The lowest concentration of SARS-CoV-2 viral copies this assay can detect is 138 copies/mL. A negative result does not preclude SARS-Cov-2 infection and should not be used as the sole basis for treatment or other patient management decisions. A negative result may occur with  improper specimen collection/handling, submission of specimen other than nasopharyngeal swab, presence of viral mutation(s) within the areas targeted by this assay, and inadequate number of viral copies(<138 copies/mL). A negative result must be combined with clinical observations, patient history, and epidemiological information. The expected result is Negative.  Fact Sheet for Patients:  EntrepreneurPulse.com.au  Fact Sheet for Healthcare Providers:  IncredibleEmployment.be  This test is no t yet approved or cleared by the [place]  FDA and  has been authorized for detection and/or diagnosis of SARS-CoV-2 by FDA under an Emergency Use Authorization (EUA). This EUA will remain  in effect (meaning this test can be used) for the duration of the COVID-19 declaration under Section 564(b)(1) of the Act, 21 U.S.C.section 360bbb-3(b)(1), unless the authorization is terminated  or revoked sooner.       Influenza A by PCR NEGATIVE NEGATIVE Final   Influenza B by PCR NEGATIVE NEGATIVE Final    Comment: (NOTE) The Xpert Xpress SARS-CoV-2/FLU/RSV plus assay is intended as an aid in the diagnosis of influenza from Nasopharyngeal swab specimens and should not be used as a sole basis for treatment. Nasal washings and aspirates are unacceptable for Xpert Xpress  SARS-CoV-2/FLU/RSV testing.  Fact Sheet for Patients: EntrepreneurPulse.com.au  Fact Sheet for Healthcare Providers: IncredibleEmployment.be  This test is not yet approved or cleared by the [place] FDA and has been authorized for detection and/or diagnosis of SARS-CoV-2 by FDA under an Emergency Use Authorization (EUA). This EUA will remain in effect (meaning this test can be used) for the duration of the COVID-19 declaration under Section 564(b)(1) of the Act, 21 U.S.C. section 360bbb-3(b)(1), unless the authorization is terminated or revoked.  Performed at [facility], [address], [place], [place] [address]      Radiology Studies: DG Chest 2 View  Result Date: 05/28/2020 CLINICAL DATA:  Chest pain EXAM: CHEST - 2 VIEW COMPARISON:  Prior chest x-ray 08/24/2014 FINDINGS: Moderate to large left-sided pneumothorax likely secondary to rupture of a large bleb in the superolateral aspect of the chest seen on prior imaging. Tethering of the lateral mid lung to the pleural surface is noted. Cardiac and mediastinal contours are within normal limits. No evidence of right-sided pneumothorax. Advanced emphysematous and chronic bronchitic changes again noted. No acute osseous abnormality. IMPRESSION: Moderate to large left-sided pneumothorax likely secondary to rupture of a large bleb. There is evidence of tethering of the lateral mid lung to the pleural surface. Advanced emphysema and chronic bronchitic changes. Electronically Signed   By: Jacqulynn Cadet M.D.   On: 05/28/2020 11:20   CT CHEST WO CONTRAST  Result Date: 05/29/2020 CLINICAL DATA:  Follow-up pneumothorax is in a 65 year old male. EXAM: CT CHEST WITHOUT CONTRAST TECHNIQUE: Multidetector CT imaging of the chest was performed following the standard protocol without IV contrast. COMPARISON:  Chest x-ray from May 29, 2020. FINDINGS: Cardiovascular: Calcifications in the thoracic aorta.  No signs of aneurysmal dilation. Heart size normal without pericardial effusion. Two vessel coronary artery calcification. No pericardial effusion with normal heart size. Central pulmonary vasculature is normal caliber. Limited assessment of cardiovascular structures given lack of intravenous contrast. Mediastinum/Nodes: Esophagus grossly normal. No axillary, hilar or mediastinal lymphadenopathy. Lungs/Pleura: Juxta cardiac nodularity measuring 11 mm along the LEFT mediastinal border is quite similar to previous imaging. There is a LEFT-sided thoracostomy tube in place entering via lateral LEFT chest. This passes into the major fissure and terminates along the lateral chest wall. Small pneumothorax with apical and anterior component, less than 10%. Signs of pulmonary emphysema which are similar to prior imaging. Small amount of subcutaneous emphysema in the LEFT-lateral chest wall. RIGHT chest with similar pleural and parenchymal scarring in the RIGHT upper lobe. Small nodules adjacent to bandlike scarring (image 32/4) 2 adjacent nodules measuring 6 and 8 mm. Another small nodule seen posteriorly in the RIGHT upper lobe amidst bullous changes measuring 6 mm on image 34 of series 4 also unchanged. Airways are patent. Upper  Abdomen: Low-density focus along the lateral margin of the RIGHT hemi liver is unchanged. Suspect background hepatic steatosis, mild. Cholelithiasis. Visualized portions of the gallbladder otherwise unremarkable. Visualized pancreas, spleen, adrenal glands and kidneys are unremarkable. No acute upper abdominal process. Small hiatal hernia. Musculoskeletal: No acute bone finding or destructive bone process. Spinal degenerative changes. IMPRESSION: 1. LEFT-sided thoracostomy tube in place, pigtail type catheter with small, suspect less than 10% pneumothorax but with associated subcutaneous emphysema. 2. Bullous disease in the apices with stable appearance of pleural and parenchymal scarring and  nodular changes in the RIGHT upper lobe. 3. Stable appearance of nodular changes along the LEFT mediastinal border. Electronically Signed   By: Zetta Bills M.D.   On: 05/29/2020 11:34   DG Chest Port 1 View  Result Date: 05/29/2020 CLINICAL DATA:  65 year old male with emphysema, spontaneous left pneumothorax status post chest tube placement. EXAM: PORTABLE CHEST 1 VIEW COMPARISON:  Portable chest 05/28/2020 and earlier. FINDINGS: Portable AP upright view at 0656 hours. Stable pigtail left lower chest tube. Bullous emphysema redemonstrated, with suspected small or trace residual left pneumothorax. Tethering of the left mid lung against the chest wall again noted. Stable cardiac size and mediastinal contours. No acute pulmonary opacity. Visualized tracheal air column is within normal limits. No acute osseous abnormality identified. Negative visible bowel gas pattern. IMPRESSION: 1. Stable left chest tube. 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 2. No new cardiopulmonary abnormality. Electronically Signed   By: Genevie Ann M.D.   On: 05/29/2020 07:39   DG Chest Portable 1 View  Result Date: 05/28/2020 CLINICAL DATA:  Chest tube placement EXAM: PORTABLE CHEST 1 VIEW COMPARISON:  Same day chest radiograph FINDINGS: There has been interval placement of a pleural pigtail catheter on the left with a decreased left pneumothorax, now small in size. Additional blebs in bola are noted in both lungs. No pleural effusion. No right-sided pneumothorax. The heart appears normal. IMPRESSION: Interval placement of a pleural pigtail catheter with decreased left pneumothorax, now small in size. Electronically Signed   By: Zerita Boers M.D.   On: 05/28/2020 13:02    Scheduled Meds: . vitamin C  250 mg Oral Daily  . aspirin EC  81 mg Oral Daily  . atorvastatin  40 mg Oral QHS  . azithromycin  500 mg Oral Daily  . cholecalciferol  1,000 Units  Oral Daily  . enoxaparin (LOVENOX) injection  40 mg Subcutaneous Q24H  . loratadine  10 mg Oral Daily  . metoprolol succinate  12.5 mg Oral Daily  . thiamine  50 mg Oral Daily  . zinc sulfate  220 mg Oral Daily   Continuous Infusions:   LOS: 0 days   Time spent: 40 minutes. More than 50% of the time was spent in counseling/coordination of care  Lorella Nimrod, MD [facility]  If 7PM-7AM, please contact night-coverage [email or web address]  05/29/2020, 2:15 PM   This record has been created using Systems analyst. Errors have been sought and corrected,but may not always be located. Such creation errors do not reflect on the standard of care.

## 2020-05-29 NOTE — Progress Notes (Signed)
Nutrition Brief Note  MD consult received for assessment of nutrition status. Spoke with pt at bedside. Reports stable weight and great appetite not and PTA. Also reports he is active at baseline, works outside often and is generally in good health.  Wt Readings from Last 15 Encounters:  05/28/20 107.5 kg  01/06/20 104.3 kg  12/23/18 104.3 kg  02/19/17 105.2 kg  12/01/16 103.4 kg  09/01/14 99.1 kg  08/24/14 99.3 kg  08/18/14 98.9 kg  08/11/14 99.1 kg  06/23/14 96.5 kg    Body mass index is 29.62 kg/m. Patient meets criteria for overweight based on current BMI.   Current diet order is heart healthy, patient is consuming approximately 100% of meals at this time. Labs and medications reviewed.   No nutrition interventions warranted at this time. If nutrition issues arise, please consult RD.   Ranell Patrick, RD, LDN Clinical Dietitian Pager on [place]

## 2020-05-29 NOTE — Evaluation (Signed)
Occupational Therapy Evaluation Patient Details Name: Terry Freeman MRN: [id number] DOB: [birth year] Today's Date: 05/29/2020    History of Present Illness 65 y.o. male with medical history significant for CAD with history of MI and stent in 2012, COPD, HTN, HLD, GERD, who presents with shortness of breath on awakening associated with chest tightness.  Reports using his albuterol inhaler with minimal improvement and then took a nitroglycerin also with minimal improvement.  Patient reports spending all day outdoors the day prior and had allergy symptoms of sinus congestion but otherwise felt fine when he went to bed.  Denies fever or chills.  Describes his chest tightness as retrosternal, worse on coughing, nonradiating of moderate intensity.   Clinical Impression   Upon entering the room, pt supine in bed with wife present in room. Pt agreeable to OT intervention this session. Pt reports being very active and working full time at baseline. Pt very eager to get back to full independence. Pt on removed from 2 L O2 via [place] during this session and monitored closely with pt not reporting any SOB. Pt carries chest vac with him and demonstrates functional mobility, toileting, LB dressing, ambulation 500' +, and navigation up/down stairs without physical assistance from therapist at independent level. OT did answer questions pt and family member had with no further concerns at this time. OT to SIGN OFF. Thank you for this referral.   Follow Up Recommendations  No OT follow up    Equipment Recommendations  None recommended by OT       Precautions / Restrictions Precautions Precaution Comments: chest tube      Mobility Bed Mobility Overal bed mobility: Modified Independent             General bed mobility comments: HOB elevated but no physical assistance needed and without use of bed rails    Transfers Overall transfer level: Independent Equipment used: None                   Balance Overall balance assessment: Independent                                         ADL either performed or assessed with clinical judgement   ADL Overall ADL's : Independent                                             Vision Patient Visual Report: No change from baseline              Pertinent Vitals/Pain Pain Assessment: No/denies pain     Hand Dominance Right   Extremity/Trunk Assessment Upper Extremity Assessment Upper Extremity Assessment: Overall WFL for tasks assessed   Lower Extremity Assessment Lower Extremity Assessment: Overall WFL for tasks assessed   Cervical / Trunk Assessment Cervical / Trunk Assessment: Normal   Communication Communication Communication: No difficulties   Cognition Arousal/Alertness: Awake/alert Behavior During Therapy: WFL for tasks assessed/performed Overall Cognitive Status: Within Functional Limits for tasks assessed                                                Home Living Family/patient expects  to be discharged to:: Private residence Living Arrangements: Spouse/significant other Available Help at Discharge: Family;Available 24 hours/day Type of Home: House Home Access: Stairs to enter [employer] of Steps: 5 Entrance Stairs-Rails: Right;Left Home Layout: One level     Bathroom Shower/Tub: Tub/shower unit         Home Equipment: Environmental consultant - 2 wheels;Bedside commode;Cane - quad;Shower seat          Prior Functioning/Environment Level of Independence: Independent        Comments: works full time and is very active at baseline                 OT Goals(Current goals can be found in the care plan section) Acute Rehab OT Goals Patient Stated Goal: to go home OT Goal Formulation: With patient      AM-PAC OT "6 Clicks" Daily Activity     Outcome Measure Help from another person eating meals?: None Help from another person taking care of  personal grooming?: None Help from another person toileting, which includes using toliet, bedpan, or urinal?: None Help from another person bathing (including washing, rinsing, drying)?: None Help from another person to put on and taking off regular upper body clothing?: None Help from another person to put on and taking off regular lower body clothing?: None 6 Click Score: 24   End of Session Nurse Communication: Mobility status;Other (comment) (Pt left on RA)  Activity Tolerance: Patient tolerated treatment well Patient left: in bed;with call bell/phone within reach;with family/visitor present  OT Visit Diagnosis: Unsteadiness on feet (R26.81);Repeated falls (R29.6);Muscle weakness (generalized) (M62.81)                Time: 8022-3361 OT Time Calculation (min): 17 min Charges:  OT General Charges $OT Visit: 1 Visit OT Evaluation $OT Eval Low Complexity: 1 Low OT Treatments $Therapeutic Activity: 8-22 mins  Darleen Crocker, MS, OTR/L , CBIS ascom [phone]  05/29/20, 11:10 AM

## 2020-05-30 ENCOUNTER — Inpatient Hospital Stay: Payer: Self-pay

## 2020-05-30 LAB — GLUCOSE, CAPILLARY
Glucose-Capillary: 93 mg/dL (ref 70–99)
Glucose-Capillary: 94 mg/dL (ref 70–99)
Glucose-Capillary: 87 mg/dL (ref 70–99)

## 2020-05-30 MED ORDER — HYPROMELLOSE (GONIOSCOPIC) 2.5 % OP SOLN: 1.0000 [drp] | Freq: Three times a day (TID) | OPHTHALMIC | Status: DC | PRN

## 2020-05-30 MED ORDER — POLYVINYL ALCOHOL 1.4 % OP SOLN
1.0000 [drp] | OPHTHALMIC | Status: DC | PRN
  Administered 2020-05-30: 1 [drp] via OPHTHALMIC
  Filled 2020-05-30: qty 15

## 2020-05-30 NOTE — Progress Notes (Signed)
PROGRESS NOTE    Terry Freeman  QAS:[id number] DOB: January 15, 1956 DOA: 05/28/2020 PCP: Bunnie Pion, FNP   Brief Narrative: Taken from H&P. Terry Freeman is a 65 y.o. male with medical history significant for CAD with history of MI and stent in 2012, COPD, HTN, HLD, GERD, who presents with shortness of breath on awakening associated with chest tightness.  Reports using his albuterol inhaler with minimal improvement and then took a nitroglycerin also with minimal improvement.  Patient reports spending all day outdoors the day prior and had allergy symptoms of sinus congestion but otherwise felt fine when he went to bed.   On arrival he was hemodynamically stable, chest x-ray with moderate to large left-sided pneumothorax likely secondary to rupture of a large bleb with advanced emphysema.  General surgery was consulted and he was placed a pigtail catheter, CT chest today with 10% residual pneumothorax.  Chest tube was placed underwater today, plan to remove tomorrow.  Subjective: Patient was seen and examined today.  No new complaint.  Wants to go home, hopefully can leave tomorrow after chest tube removal if remains stable.  Wife at bedside  Assessment & Plan:   Principal Problem:   COPD exacerbation ([place]) Active Problems:   CAD (coronary artery disease)   HTN (hypertension)   Stage 3a chronic kidney disease (HCC)   Pneumothorax  Left pneumothorax.  Clinically seems improving, CT chest today with 10% residual pneumothorax.  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 General surgery is following-appreciate their recommendations. -Continue with left pleural pigtail catheter-it was placed under waterseal today with a plan to remove tomorrow after getting chest x-ray. -Patient will need outpatient thoracic surgery evaluation due to bullous emphysematous disease.  Concern of COPD exacerbation.  No wheezing . -Continue with DuoNeb. -Continue with prednisone -Continue  with Zithromax.  CAD , with history of MI and stents - Patient had chest pain but with 2 negative troponins and EKG nonacute - ACS not suspected.  Chest pain likely related to pneumothorax - Continue home metoprolol, ezetimibe    HTN (hypertension).  Blood pressure within goal. - Continue home metoprolol    Stage 3a chronic kidney disease (HCC) - Creatinine at baseline - Monitor renal function. -Avoid nephrotoxins  Objective: Vitals:   05/30/20 0412 05/30/20 0808 05/30/20 1150 05/30/20 1542  BP: 137/84 (!) 135/93 123/84 132/84  Pulse: 64 76 67 77  Resp: 17 18 16 18   Temp: 97.7 F (36.5 C) 98.8 F (37.1 C) 98.1 F (36.7 C) 98 F (36.7 C)  TempSrc:      SpO2: 96% 95% 95% 97%  Weight:      Height:        Intake/Output Summary (Last 24 hours) at 05/30/2020 1635 Last data filed at 05/30/2020 1039 Gross per 24 hour  Intake --  Output 0 ml  Net 0 ml   Filed Weights   05/28/20 1016  Weight: 107.5 kg    Examination:  General.  Well-developed gentleman, in no acute distress. Pulmonary.  Lungs clear bilaterally, normal respiratory effort. CV.  Regular rate and rhythm, no JVD, rub or murmur. Abdomen.  Soft, nontender, nondistended, BS positive. CNS.  Alert and oriented x3.  No focal neurologic deficit. Extremities.  No edema, no cyanosis, pulses intact and symmetrical. Psychiatry.  Judgment and insight appears normal.  DVT prophylaxis: SCDs Code Status: Full Family Communication: Wife was updated at bedside Disposition Plan:  Status is: Inpatient  Remains inpatient appropriate because:Inpatient level of care appropriate due  to severity of illness   Dispo: The patient is from: Home              Anticipated d/c is to: Home              Patient currently is not medically stable to d/c.   Difficult to place patient No               Level of care: Med-Surg  All the records are reviewed and case discussed with Care Management/Social Worker. Management plans  discussed with the patient, nursing and they are in agreement.  Consultants:   General surgery  Procedures:  Chest tube placement  Antimicrobials:   Data Reviewed: I have personally reviewed following labs and imaging studies  CBC: Recent Labs  Lab 05/28/20 1023  WBC 8.5  HGB 15.1  HCT 44.3  MCV 84.9  PLT 263   Basic Metabolic Panel: Recent Labs  Lab 05/28/20 1023  NA 138  K 3.9  CL 103  CO2 25  GLUCOSE 122*  BUN 11  CREATININE 1.38*  CALCIUM 9.2   GFR: Estimated Creatinine Clearance: 73.6 mL/min (A) (by C-G formula based on SCr of 1.38 mg/dL (H)). Liver Function Tests: No results for input(s): AST, ALT, ALKPHOS, BILITOT, PROT, ALBUMIN in the last 168 hours. No results for input(s): LIPASE, AMYLASE in the last 168 hours. No results for input(s): AMMONIA in the last 168 hours. Coagulation Profile: Recent Labs  Lab 05/28/20 1023  INR 1.0   Cardiac Enzymes: No results for input(s): CKTOTAL, CKMB, CKMBINDEX, TROPONINI in the last 168 hours. BNP (last 3 results) No results for input(s): PROBNP in the last 8760 hours. HbA1C: No results for input(s): HGBA1C in the last 72 hours. CBG: Recent Labs  Lab 05/29/20 1641 05/29/20 2058 05/30/20 0811 05/30/20 1152 05/30/20 1544  GLUCAP 94 142* 93 94 87   Lipid Profile: No results for input(s): CHOL, HDL, LDLCALC, TRIG, CHOLHDL, LDLDIRECT in the last 72 hours. Thyroid Function Tests: No results for input(s): TSH, T4TOTAL, FREET4, T3FREE, THYROIDAB in the last 72 hours. Anemia Panel: No results for input(s): VITAMINB12, FOLATE, FERRITIN, TIBC, IRON, RETICCTPCT in the last 72 hours. Sepsis Labs: No results for input(s): PROCALCITON, LATICACIDVEN in the last 168 hours.  Recent Results (from the past 240 hour(s))  Resp Panel by RT-PCR (Flu A&B, Covid) Nasopharyngeal Swab     Status: None   Collection Time: 05/28/20  1:13 PM   Specimen: Nasopharyngeal Swab; Nasopharyngeal(NP) swabs in vial transport medium   Result Value Ref Range Status   SARS Coronavirus 2 by RT PCR NEGATIVE NEGATIVE Final    Comment: (NOTE) SARS-CoV-2 target nucleic acids are NOT DETECTED.  The SARS-CoV-2 RNA is generally detectable in upper respiratory specimens during the acute phase of infection. The lowest concentration of SARS-CoV-2 viral copies this assay can detect is 138 copies/mL. A negative result does not preclude SARS-Cov-2 infection and should not be used as the sole basis for treatment or other patient management decisions. A negative result may occur with  improper specimen collection/handling, submission of specimen other than nasopharyngeal swab, presence of viral mutation(s) within the areas targeted by this assay, and inadequate number of viral copies(<138 copies/mL). A negative result must be combined with clinical observations, patient history, and epidemiological information. The expected result is Negative.  Fact Sheet for Patients:  EntrepreneurPulse.com.au  Fact Sheet for Healthcare Providers:  IncredibleEmployment.be  This test is no t yet approved or cleared by the [place] and  has been authorized for detection and/or diagnosis of SARS-CoV-2 by FDA under an Emergency Use Authorization (EUA). This EUA will remain  in effect (meaning this test can be used) for the duration of the COVID-19 declaration under Section 564(b)(1) of the Act, 21 U.S.C.section 360bbb-3(b)(1), unless the authorization is terminated  or revoked sooner.       Influenza A by PCR NEGATIVE NEGATIVE Final   Influenza B by PCR NEGATIVE NEGATIVE Final    Comment: (NOTE) The Xpert Xpress SARS-CoV-2/FLU/RSV plus assay is intended as an aid in the diagnosis of influenza from Nasopharyngeal swab specimens and should not be used as a sole basis for treatment. Nasal washings and aspirates are unacceptable for Xpert Xpress SARS-CoV-2/FLU/RSV testing.  Fact Sheet for  Patients: EntrepreneurPulse.com.au  Fact Sheet for Healthcare Providers: IncredibleEmployment.be  This test is not yet approved or cleared by the [place] FDA and has been authorized for detection and/or diagnosis of SARS-CoV-2 by FDA under an Emergency Use Authorization (EUA). This EUA will remain in effect (meaning this test can be used) for the duration of the COVID-19 declaration under Section 564(b)(1) of the Act, 21 U.S.C. section 360bbb-3(b)(1), unless the authorization is terminated or revoked.  Performed at [facility], [address] [place], [place] [address]      Radiology Studies: DG Chest 2 View  Result Date: 05/30/2020 CLINICAL DATA:  65 year old male with emphysema, spontaneous left pneumothorax, chest tube. EXAM: CHEST - 2 VIEW COMPARISON:  Chest CT and portable chest yesterday. FINDINGS: PA and lateral views. Stable left chest tube. Bullous emphysema with architectural distortion about the left hilum. Probable trace residual pneumothorax as seen by CT yesterday, less apparent from 0656 hours yesterday. Stable cardiac size and mediastinal contours. No new pulmonary opacity. Visualized tracheal air column is within normal limits. No acute osseous abnormality identified. Negative visible bowel gas pattern. IMPRESSION: 1. Stable left chest tube and trace residual left pneumothorax as seen by CT yesterday. 2. Bullous emphysema. No new cardiopulmonary abnormality. Electronically Signed   By: Genevie Ann M.D.   On: 05/30/2020 06:47   CT CHEST WO CONTRAST  Result Date: 05/29/2020 CLINICAL DATA:  Follow-up pneumothorax is in a 65 year old male. EXAM: CT CHEST WITHOUT CONTRAST TECHNIQUE: Multidetector CT imaging of the chest was performed following the standard protocol without IV contrast. COMPARISON:  Chest x-ray from May 29, 2020. FINDINGS: Cardiovascular: Calcifications in the thoracic aorta. No signs of aneurysmal dilation. Heart  size normal without pericardial effusion. Two vessel coronary artery calcification. No pericardial effusion with normal heart size. Central pulmonary vasculature is normal caliber. Limited assessment of cardiovascular structures given lack of intravenous contrast. Mediastinum/Nodes: Esophagus grossly normal. No axillary, hilar or mediastinal lymphadenopathy. Lungs/Pleura: Juxta cardiac nodularity measuring 11 mm along the LEFT mediastinal border is quite similar to previous imaging. There is a LEFT-sided thoracostomy tube in place entering via lateral LEFT chest. This passes into the major fissure and terminates along the lateral chest wall. Small pneumothorax with apical and anterior component, less than 10%. Signs of pulmonary emphysema which are similar to prior imaging. Small amount of subcutaneous emphysema in the LEFT-lateral chest wall. RIGHT chest with similar pleural and parenchymal scarring in the RIGHT upper lobe. Small nodules adjacent to bandlike scarring (image 32/4) 2 adjacent nodules measuring 6 and 8 mm. Another small nodule seen posteriorly in the RIGHT upper lobe amidst bullous changes measuring 6 mm on image 34 of series 4 also unchanged. Airways are patent. Upper Abdomen: Low-density focus along the lateral  margin of the RIGHT hemi liver is unchanged. Suspect background hepatic steatosis, mild. Cholelithiasis. Visualized portions of the gallbladder otherwise unremarkable. Visualized pancreas, spleen, adrenal glands and kidneys are unremarkable. No acute upper abdominal process. Small hiatal hernia. Musculoskeletal: No acute bone finding or destructive bone process. Spinal degenerative changes. IMPRESSION: 1. LEFT-sided thoracostomy tube in place, pigtail type catheter with small, suspect less than 10% pneumothorax but with associated subcutaneous emphysema. 2. Bullous disease in the apices with stable appearance of pleural and parenchymal scarring and nodular changes in the RIGHT upper lobe. 3.  Stable appearance of nodular changes along the LEFT mediastinal border. Electronically Signed   By: Zetta Bills M.D.   On: 05/29/2020 11:34   DG Chest Port 1 View  Result Date: 05/29/2020 CLINICAL DATA:  65 year old male with emphysema, spontaneous left pneumothorax status post chest tube placement. EXAM: PORTABLE CHEST 1 VIEW COMPARISON:  Portable chest 05/28/2020 and earlier. FINDINGS: Portable AP upright view at 0656 hours. Stable pigtail left lower chest tube. Bullous emphysema redemonstrated, with suspected small or trace residual left pneumothorax. Tethering of the left mid lung against the chest wall again noted. Stable cardiac size and mediastinal contours. No acute pulmonary opacity. Visualized tracheal air column is within normal limits. No acute osseous abnormality identified. Negative visible bowel gas pattern. IMPRESSION: 1. Stable left chest tube. 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 2. No new cardiopulmonary abnormality. Electronically Signed   By: Genevie Ann M.D.   On: 05/29/2020 07:39    Scheduled Meds: . vitamin C  250 mg Oral Daily  . aspirin EC  81 mg Oral Daily  . atorvastatin  40 mg Oral QHS  . azithromycin  500 mg Oral Daily  . cholecalciferol  1,000 Units Oral Daily  . enoxaparin (LOVENOX) injection  40 mg Subcutaneous Q24H  . loratadine  10 mg Oral Daily  . metoprolol succinate  12.5 mg Oral Daily  . thiamine  50 mg Oral Daily  . zinc sulfate  220 mg Oral Daily   Continuous Infusions:   LOS: 1 day   Time spent: 30 minutes. More than 50% of the time was spent in counseling/coordination of care  Lorella Nimrod, MD [facility]  If 7PM-7AM, please contact night-coverage [email or web address]  05/30/2020, 4:35 PM   This record has been created using Systems analyst. Errors have been sought and corrected,but may not always be located. Such creation errors do not reflect  on the standard of care.

## 2020-05-30 NOTE — Plan of Care (Signed)
  Problem: Education: Goal: Knowledge of General Education information will improve Description Including pain rating scale, medication(s)/side effects and non-pharmacologic comfort measures Outcome: Progressing   

## 2020-05-30 NOTE — Progress Notes (Addendum)
[facility] SURGICAL PROGRESS NOTE (cpt [phone])  Hospital Day(s): 1.   Interval History: Patient seen and examined, no acute events or new complaints overnight. Patient reports he continues to feel better. He denies fever, chills, cough, SOB, or CP. Chest tube with minimal output. He remains without an air leak. CXR continues to show near resolution in pneumothorax. He is ambulating and using IS without issue.   Interestingly today, he does bring up that he has had umbilical and left inguinal hernias for some time now. He does notice this can be sporadically painful with heavy lifting. Never any signs or symptoms or incarceration or strangulation. He believes they are related to his appendectomy >10 years ago. He is interested in getting these fixed.   Review of Systems:  Constitutional: denies fever, chills  HEENT: denies cough or congestion  Respiratory: denies any shortness of breath  Cardiovascular: denies chest pain or palpitations  Gastrointestinal: denies abdominal pain, N/V, + umbilical hernia, + inguinal hernia  Genitourinary: denies burning with urination or urinary frequency   Vital signs in last 24 hours: [min-max] current  Temp:  [97.3 F (36.3 C)-97.7 F (36.5 C)] 97.7 F (36.5 C) (05/10 0412) Pulse Rate:  [61-75] 64 (05/10 0412) Resp:  [17-18] 17 (05/10 0412) BP: (120-137)/(64-84) 137/84 (05/10 0412) SpO2:  [94 %-96 %] 96 % (05/10 0412)     Height: 6\' 3"  (190.5 cm) Weight: 107.5 kg BMI (Calculated): 29.62   Intake/Output last 2 shifts:  05/09 0701 - 05/10 0700 In: 120 [P.O.:120] Out: -    Physical Exam:  Constitutional: alert, cooperative and no distress  HENT: normocephalic without obvious abnormality  Eyes: PERRL, EOM's grossly intact and symmetric  Respiratory: breathing non-labored at rest; CTAB, on [place] Cardiovascular: regular rate and sinus rhythm  Chest: Chest tube to the left lateral chest wall; site is CDI; no appreciable air leak; no  output Gastrointestinal: Soft, non-tender, non-distended, no rebound/guarding. Soft umbilical and left inguinal hernia, these are reducible.  Musculoskeletal: no edema or wounds, motor and sensation grossly intact, NT    Labs:  CBC Latest Ref Rng & Units 05/28/2020 12/01/2016 08/24/2014  WBC 4.0 - 10.5 K/uL 8.5 10.5 11.8(H)  Hemoglobin 13.0 - 17.0 g/dL 15.1 15.8 14.1  Hematocrit 39.0 - 52.0 % 44.3 47.5 43.5  Platelets 150 - 400 K/uL 240 286 333   CMP Latest Ref Rng & Units 05/28/2020 12/01/2016 06/22/2014  Glucose 70 - 99 mg/dL 122(H) 104(H) 114(H)  BUN 8 - 23 mg/dL 11 QUANTITY NOT SUFFICIENT, UNABLE TO PERFORM TEST 17  Creatinine 0.61 - 1.24 mg/dL 1.38(H) QUANTITY NOT SUFFICIENT, UNABLE TO PERFORM TEST 1.31(H)  Sodium 135 - 145 mmol/L 138 139 137  Potassium 3.5 - 5.1 mmol/L 3.9 4.1 3.6  Chloride 98 - 111 mmol/L 103 107 103  CO2 22 - 32 mmol/L 25 15(L) 27  Calcium 8.9 - 10.3 mg/dL 9.2 9.2 8.7(L)  Total Protein 6.5 - 8.1 g/dL - - 7.1  Total Bilirubin 0.3 - 1.2 mg/dL - - 1.3(H)  Alkaline Phos 38 - 126 U/L - - 63  AST 15 - 41 U/L - - 28  ALT 17 - 63 U/L - - 30     Imaging studies:   CXR (05/30/2020) personally reviewed which shows resolving PTX, chest tube in place, bullae seen, and radiologist report reviewed:  IMPRESSION: 1. Stable left chest tube and trace residual left pneumothorax as seen by CT yesterday. 2. Bullous emphysema. No new cardiopulmonary abnormality.   Assessment/Plan: (ICD-10's: J93.11)  65 y.o. male with improvement in left sided spontaneous pneumothorax s/p chest tube placement without air leak, complicated by pertinent comorbidities including significant history of tobacco abuse.   - I will place his chest tube to water seal this morning. Plan for repeat CXR in the AM. If his pneumothorax remains resolved, I will plan on removing his chest tube in the morning.   - No need for emergent surgical intervention; however, he understands that if he should fail to improve,  then he would likely need VATS and blebectomy. This would require transfer to [facility] as we are without thoracic surgery services here              - Okay to mobilize off suction              - Pain control prn; antiemetics prn             - Further management per primary services; we will follow      - Discharge Planning: Anticipate DC tomorrow, I will also place follow up with surgery for evaluation for inguinal and umbilical hernia repair.   All of the above findings and recommendations were discussed with the patient, patient's family (wife at bedside), and the medical team, and all of patient's and family's questions were answered to their expressed satisfaction.   -- Terry Simon, PA-C [facility] 05/30/2020, 7:32 AM [phone] M-F: 7am - 4pm  I saw and evaluated the patient.  I agree with the above documentation, exam, and plan, which I have edited where appropriate. Fredirick Maudlin  5:23 PM

## 2020-05-31 ENCOUNTER — Inpatient Hospital Stay: Payer: Self-pay

## 2020-05-31 NOTE — Progress Notes (Signed)
Pt d/c home via POV. D/C paperwork reviewed with pt and wife at bedside both expressed understanding. Chest tube removed by PA Olean Ree, dressing to site c/d/i.  NAD noted at time of d/c, all belongings taken.  IV removed from pt L hand and wrist without issue.

## 2020-05-31 NOTE — Discharge Summary (Signed)
Physician Discharge Summary  Terry Freeman YNW:[id number] DOB: 1955/07/17 DOA: 05/28/2020  PCP: Bunnie Pion, FNP  Admit date: 05/28/2020 Discharge date: 05/31/2020  Admitted From: Home Disposition: Home  Recommendations for Outpatient Follow-up:  1. Follow up with PCP in 1-2 weeks 2. Follow-up with general surgery 3. Follow-up with thoracic surgery 4. Please obtain BMP/CBC in one week 5. Please follow up on the following pending results: None  Home Health: No Equipment/Devices: None Discharge Condition: Stable CODE STATUS: Full Diet recommendation: Heart Healthy   Brief/Interim Summary: Terry Henton Solomonis a 65 y.o.malewith medical history significant forCAD with history of MI and stent in 2012, COPD, HTN, HLD, GERD, who presents with shortness of breath on awakening associated with chest tightness. Reports using his albuterol inhaler with minimal improvement and then took a nitroglycerin also with minimal improvement. Patient reports spending all day outdoors the day prior and had allergy symptoms of sinus congestion but otherwise felt fine when he went to bed. On arrival he was hemodynamically stable, chest x-ray with moderate to large left-sided pneumothorax likely secondary to rupture of a large bleb with advanced emphysema.  General surgery was consulted and he was placed a pigtail catheter, CT chest today with 10% residual pneumothorax.  Chest tube was placed under water for 24 hours and a repeat chest x-ray with very mild residual pneumothorax.  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  Patient has an history of coronary artery disease with stents placement.  No chest pain and troponin remains negative.  He will continue his home medications.  Patient also has an history of CKD stage IIIa.  Creatinine remained stable.  Patient will  continue rest of his home medications and follow-up with his providers.  Discharge Diagnoses:  Principal Problem:   COPD exacerbation ([place]) Active Problems:   CAD (coronary artery disease)   HTN (hypertension)   Stage 3a chronic kidney disease ([place])   Pneumothorax   Discharge Instructions  Discharge Instructions    Call MD for:  difficulty breathing, headache or visual disturbances   Complete by: As directed    Call MD for:  persistant dizziness or light-headedness   Complete by: As directed    Diet - low sodium heart healthy   Complete by: As directed    Discharge instructions   Complete by: As directed    It was pleasure taking care of you. As we discussed please make a appointment with thoracic surgery for further recommendations. Your surgeon ordered a repeat chest x-ray on 06/14/2020 please have it done as instructed and follow-up with them as an outpatient.   Increase activity slowly   Complete by: As directed    No wound care   Complete by: As directed    Remove dressing in 24 hours   Complete by: As directed      Allergies as of 05/31/2020   No Known Allergies     Medication List    STOP taking these medications   amoxicillin-clavulanate 875-125 MG tablet Commonly known as: AUGMENTIN   budesonide-formoterol 160-4.5 MCG/ACT inhaler Commonly known as: SYMBICORT   clopidogrel 75 MG tablet Commonly known as: PLAVIX   metoprolol tartrate 25 MG tablet Commonly known as: LOPRESSOR   mometasone-formoterol 100-5 MCG/ACT Aero Commonly known as: DULERA   simvastatin 40 MG tablet Commonly known as: ZOCOR     TAKE these medications   aspirin 81  MG tablet Take 81 mg by mouth daily. What changed: Another medication with the same name was removed. Continue taking this medication, and follow the directions you see here.   atorvastatin 40 MG tablet Commonly known as: LIPITOR Take 40 mg by mouth at bedtime.   cetirizine 10 MG tablet Commonly known as:  ZYRTEC Take 10 mg by mouth daily as needed.   cholecalciferol 25 MCG (1000 UNIT) tablet Commonly known as: VITAMIN D3 Take 1,000 Units by mouth daily.   fluticasone 50 MCG/ACT nasal spray Commonly known as: FLONASE Place 1-2 sprays into both nostrils daily.   metoprolol succinate 25 MG 24 hr tablet Commonly known as: TOPROL-XL Take 12.5 mg by mouth daily.   multivitamin with minerals Tabs tablet Take 1 tablet by mouth daily.   omeprazole 20 MG capsule Commonly known as: PRILOSEC Take 20 mg by mouth daily.   ProAir HFA 108 (90 Base) MCG/ACT inhaler Generic drug: albuterol Inhale 2 puffs into the lungs every 4 (four) hours as needed.   ranitidine 150 MG tablet Commonly known as: ZANTAC Take 150 mg by mouth daily.   thiamine 50 MG tablet Commonly known as: VITAMIN B-1 Take 50 mg by mouth daily.   Visine 0.025-0.3 % ophthalmic solution Generic drug: naphazoline-pheniramine 1 drop 4 (four) times daily as needed for eye irritation.   vitamin C 250 MG tablet Commonly known as: ASCORBIC ACID Take 250 mg by mouth daily.   zinc gluconate 50 MG tablet Take 50 mg by mouth daily.       Follow-up Information    [place], [place] F, MD. Schedule an appointment as soon as possible for a visit in 2 week(s).   Specialty: General Surgery Why: Follow up pneumothorax, additionally wants evaluated for umbilical and inguinal hernia repair....get CXR prior to appointment Contact information: [address] [place] [address] [phone]        Bunnie Pion, [place]. Schedule an appointment as soon as possible for a visit.   Specialty: Family Medicine Contact information: [place] [address] [phone]              No Known Allergies  Consultations:  General surgery  Procedures/Studies: DG Chest 2 View  Result Date: 05/31/2020 CLINICAL DATA:  Left pneumothorax, chest tube EXAM: CHEST - 2 VIEW COMPARISON:  05/30/2020  FINDINGS: Left lateral chest tube remains in place, unchanged. Small residual left apical pneumothorax, stable. Emphysema. Bilateral upper lobe scarring. No effusions. Heart is normal size. IMPRESSION: Stable position of left lateral chest tube with small residual left apical pneumothorax. COPD/chronic changes. Electronically Signed   By: Rolm Baptise M.D.   On: 05/31/2020 08:51   DG Chest 2 View  Result Date: 05/30/2020 CLINICAL DATA:  65 year old male with emphysema, spontaneous left pneumothorax, chest tube. EXAM: CHEST - 2 VIEW COMPARISON:  Chest CT and portable chest yesterday. FINDINGS: PA and lateral views. Stable left chest tube. Bullous emphysema with architectural distortion about the left hilum. Probable trace residual pneumothorax as seen by CT yesterday, less apparent from 0656 hours yesterday. Stable cardiac size and mediastinal contours. No new pulmonary opacity. Visualized tracheal air column is within normal limits. No acute osseous abnormality identified. Negative visible bowel gas pattern. IMPRESSION: 1. Stable left chest tube and trace residual left pneumothorax as seen by CT yesterday. 2. Bullous emphysema. No new cardiopulmonary abnormality. Electronically Signed   By: Genevie Ann M.D.   On: 05/30/2020 06:47   DG Chest 2 View  Result  Date: 05/28/2020 CLINICAL DATA:  Chest pain EXAM: CHEST - 2 VIEW COMPARISON:  Prior chest x-ray 08/24/2014 FINDINGS: Moderate to large left-sided pneumothorax likely secondary to rupture of a large bleb in the superolateral aspect of the chest seen on prior imaging. Tethering of the lateral mid lung to the pleural surface is noted. Cardiac and mediastinal contours are within normal limits. No evidence of right-sided pneumothorax. Advanced emphysematous and chronic bronchitic changes again noted. No acute osseous abnormality. IMPRESSION: Moderate to large left-sided pneumothorax likely secondary to rupture of a large bleb. There is evidence of tethering of the  lateral mid lung to the pleural surface. Advanced emphysema and chronic bronchitic changes. Electronically Signed   By: Jacqulynn Cadet M.D.   On: 05/28/2020 11:20   CT CHEST WO CONTRAST  Result Date: 05/29/2020 CLINICAL DATA:  Follow-up pneumothorax is in a 65 year old male. EXAM: CT CHEST WITHOUT CONTRAST TECHNIQUE: Multidetector CT imaging of the chest was performed following the standard protocol without IV contrast. COMPARISON:  Chest x-ray from May 29, 2020. FINDINGS: Cardiovascular: Calcifications in the thoracic aorta. No signs of aneurysmal dilation. Heart size normal without pericardial effusion. Two vessel coronary artery calcification. No pericardial effusion with normal heart size. Central pulmonary vasculature is normal caliber. Limited assessment of cardiovascular structures given lack of intravenous contrast. Mediastinum/Nodes: Esophagus grossly normal. No axillary, hilar or mediastinal lymphadenopathy. Lungs/Pleura: Juxta cardiac nodularity measuring 11 mm along the LEFT mediastinal border is quite similar to previous imaging. There is a LEFT-sided thoracostomy tube in place entering via lateral LEFT chest. This passes into the major fissure and terminates along the lateral chest wall. Small pneumothorax with apical and anterior component, less than 10%. Signs of pulmonary emphysema which are similar to prior imaging. Small amount of subcutaneous emphysema in the LEFT-lateral chest wall. RIGHT chest with similar pleural and parenchymal scarring in the RIGHT upper lobe. Small nodules adjacent to bandlike scarring (image 32/4) 2 adjacent nodules measuring 6 and 8 mm. Another small nodule seen posteriorly in the RIGHT upper lobe amidst bullous changes measuring 6 mm on image 34 of series 4 also unchanged. Airways are patent. Upper Abdomen: Low-density focus along the lateral margin of the RIGHT hemi liver is unchanged. Suspect background hepatic steatosis, mild. Cholelithiasis. Visualized portions  of the gallbladder otherwise unremarkable. Visualized pancreas, spleen, adrenal glands and kidneys are unremarkable. No acute upper abdominal process. Small hiatal hernia. Musculoskeletal: No acute bone finding or destructive bone process. Spinal degenerative changes. IMPRESSION: 1. LEFT-sided thoracostomy tube in place, pigtail type catheter with small, suspect less than 10% pneumothorax but with associated subcutaneous emphysema. 2. Bullous disease in the apices with stable appearance of pleural and parenchymal scarring and nodular changes in the RIGHT upper lobe. 3. Stable appearance of nodular changes along the LEFT mediastinal border. Electronically Signed   By: Zetta Bills M.D.   On: 05/29/2020 11:34   DG Chest Port 1 View  Result Date: 05/29/2020 CLINICAL DATA:  65 year old male with emphysema, spontaneous left pneumothorax status post chest tube placement. EXAM: PORTABLE CHEST 1 VIEW COMPARISON:  Portable chest 05/28/2020 and earlier. FINDINGS: Portable AP upright view at 0656 hours. Stable pigtail left lower chest tube. Bullous emphysema redemonstrated, with suspected small or trace residual left pneumothorax. Tethering of the left mid lung against the chest wall again noted. Stable cardiac size and mediastinal contours. No acute pulmonary opacity. Visualized tracheal air column is within normal limits. No acute osseous abnormality identified. Negative visible bowel gas pattern. IMPRESSION: 1. Stable left chest tube.  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 2. No new cardiopulmonary abnormality. Electronically Signed   By: Genevie Ann M.D.   On: 05/29/2020 07:39   DG Chest Portable 1 View  Result Date: 05/28/2020 CLINICAL DATA:  Chest tube placement EXAM: PORTABLE CHEST 1 VIEW COMPARISON:  Same day chest radiograph FINDINGS: There has been interval placement of a pleural pigtail catheter on the left with a decreased left  pneumothorax, now small in size. Additional blebs in bola are noted in both lungs. No pleural effusion. No right-sided pneumothorax. The heart appears normal. IMPRESSION: Interval placement of a pleural pigtail catheter with decreased left pneumothorax, now small in size. Electronically Signed   By: Zerita Boers M.D.   On: 05/28/2020 13:02     Subjective: Patient was seen and examined today.  Chest tube was removed earlier today.  No complaints.  Wants to go home.  Wife at bedside.  Discharge Exam: Vitals:   05/31/20 0333 05/31/20 0739  BP: 122/77 120/83  Pulse: 76 80  Resp: 18 18  Temp: 97.6 F (36.4 C) 97.9 F (36.6 C)  SpO2: 93% 95%   Vitals:   05/30/20 1542 05/30/20 1927 05/31/20 0333 05/31/20 0739  BP: 132/84 110/80 122/77 120/83  Pulse: 77 76 76 80  Resp: 18 16 18 18   Temp: 98 F (36.7 C) 97.9 F (36.6 C) 97.6 F (36.4 C) 97.9 F (36.6 C)  TempSrc:  Oral Oral   SpO2: 97% 95% 93% 95%  Weight:      Height:        General: Pt is alert, awake, not in acute distress Cardiovascular: RRR, S1/S2 +, no rubs, no gallops Respiratory: CTA bilaterally, no wheezing, no rhonchi Abdominal: Soft, NT, ND, bowel sounds + Extremities: no edema, no cyanosis   The results of significant diagnostics from this hospitalization (including imaging, microbiology, ancillary and laboratory) are listed below for reference.    Microbiology: Recent Results (from the past 240 hour(s))  Resp Panel by RT-PCR (Flu A&B, Covid) Nasopharyngeal Swab     Status: None   Collection Time: 05/28/20  1:13 PM   Specimen: Nasopharyngeal Swab; Nasopharyngeal(NP) swabs in vial transport medium  Result Value Ref Range Status   SARS Coronavirus 2 by RT PCR NEGATIVE NEGATIVE Final    Comment: (NOTE) SARS-CoV-2 target nucleic acids are NOT DETECTED.  The SARS-CoV-2 RNA is generally detectable in upper respiratory specimens during the acute phase of infection. The lowest concentration of SARS-CoV-2 viral  copies this assay can detect is 138 copies/mL. A negative result does not preclude SARS-Cov-2 infection and should not be used as the sole basis for treatment or other patient management decisions. A negative result may occur with  improper specimen collection/handling, submission of specimen other than nasopharyngeal swab, presence of viral mutation(s) within the areas targeted by this assay, and inadequate number of viral copies(<138 copies/mL). A negative result must be combined with clinical observations, patient history, and epidemiological information. The expected result is Negative.  Fact Sheet for Patients:  EntrepreneurPulse.com.au  Fact Sheet for Healthcare Providers:  IncredibleEmployment.be  This test is no t yet approved or cleared by the [place] FDA and  has been authorized for detection and/or diagnosis of SARS-CoV-2 by FDA under an Emergency Use Authorization (EUA). This EUA will remain  in effect (meaning this test can be used) for the duration of the COVID-19 declaration under Section 564(b)(1) of the Act, 21 U.S.C.section 360bbb-3(b)(1), unless the  authorization is terminated  or revoked sooner.       Influenza A by PCR NEGATIVE NEGATIVE Final   Influenza B by PCR NEGATIVE NEGATIVE Final    Comment: (NOTE) The Xpert Xpress SARS-CoV-2/FLU/RSV plus assay is intended as an aid in the diagnosis of influenza from Nasopharyngeal swab specimens and should not be used as a sole basis for treatment. Nasal washings and aspirates are unacceptable for Xpert Xpress SARS-CoV-2/FLU/RSV testing.  Fact Sheet for Patients: EntrepreneurPulse.com.au  Fact Sheet for Healthcare Providers: IncredibleEmployment.be  This test is not yet approved or cleared by the [place] FDA and has been authorized for detection and/or diagnosis of SARS-CoV-2 by FDA under an Emergency Use Authorization (EUA). This  EUA will remain in effect (meaning this test can be used) for the duration of the COVID-19 declaration under Section 564(b)(1) of the Act, 21 U.S.C. section 360bbb-3(b)(1), unless the authorization is terminated or revoked.  Performed at [facility], [address] [place], [place] [address]      Labs: BNP (last 3 results) No results for input(s): BNP in the last 8760 hours. Basic Metabolic Panel: Recent Labs  Lab 05/28/20 1023  NA 138  K 3.9  CL 103  CO2 25  GLUCOSE 122*  BUN 11  CREATININE 1.38*  CALCIUM 9.2   Liver Function Tests: No results for input(s): AST, ALT, ALKPHOS, BILITOT, PROT, ALBUMIN in the last 168 hours. No results for input(s): LIPASE, AMYLASE in the last 168 hours. No results for input(s): AMMONIA in the last 168 hours. CBC: Recent Labs  Lab 05/28/20 1023  WBC 8.5  HGB 15.1  HCT 44.3  MCV 84.9  PLT 240   Cardiac Enzymes: No results for input(s): CKTOTAL, CKMB, CKMBINDEX, TROPONINI in the last 168 hours. BNP: Invalid input(s): POCBNP CBG: Recent Labs  Lab 05/29/20 1641 05/29/20 2058 05/30/20 0811 05/30/20 1152 05/30/20 1544  GLUCAP 94 142* 93 94 87   D-Dimer No results for input(s): DDIMER in the last 72 hours. Hgb A1c No results for input(s): HGBA1C in the last 72 hours. Lipid Profile No results for input(s): CHOL, HDL, LDLCALC, TRIG, CHOLHDL, LDLDIRECT in the last 72 hours. Thyroid function studies No results for input(s): TSH, T4TOTAL, T3FREE, THYROIDAB in the last 72 hours.  Invalid input(s): FREET3 Anemia work up No results for input(s): VITAMINB12, FOLATE, FERRITIN, TIBC, IRON, RETICCTPCT in the last 72 hours. Urinalysis No results found for: COLORURINE, APPEARANCEUR, [place], [place], GLUCOSEU, [place], [place], [place], PROTEINUR, UROBILINOGEN, NITRITE, LEUKOCYTESUR Sepsis Labs Invalid input(s): PROCALCITONIN,  WBC,  LACTICIDVEN Microbiology Recent Results (from the past 240 hour(s))  Resp Panel by  RT-PCR (Flu A&B, Covid) Nasopharyngeal Swab     Status: None   Collection Time: 05/28/20  1:13 PM   Specimen: Nasopharyngeal Swab; Nasopharyngeal(NP) swabs in vial transport medium  Result Value Ref Range Status   SARS Coronavirus 2 by RT PCR NEGATIVE NEGATIVE Final    Comment: (NOTE) SARS-CoV-2 target nucleic acids are NOT DETECTED.  The SARS-CoV-2 RNA is generally detectable in upper respiratory specimens during the acute phase of infection. The lowest concentration of SARS-CoV-2 viral copies this assay can detect is 138 copies/mL. A negative result does not preclude SARS-Cov-2 infection and should not be used as the sole basis for treatment or other patient management decisions. A negative result may occur with  improper specimen collection/handling, submission of specimen other than nasopharyngeal swab, presence of viral mutation(s) within the areas targeted by this assay, and inadequate number of viral copies(<138 copies/mL). A  negative result must be combined with clinical observations, patient history, and epidemiological information. The expected result is Negative.  Fact Sheet for Patients:  EntrepreneurPulse.com.au  Fact Sheet for Healthcare Providers:  IncredibleEmployment.be  This test is no t yet approved or cleared by the [place] FDA and  has been authorized for detection and/or diagnosis of SARS-CoV-2 by FDA under an Emergency Use Authorization (EUA). This EUA will remain  in effect (meaning this test can be used) for the duration of the COVID-19 declaration under Section 564(b)(1) of the Act, 21 U.S.C.section 360bbb-3(b)(1), unless the authorization is terminated  or revoked sooner.       Influenza A by PCR NEGATIVE NEGATIVE Final   Influenza B by PCR NEGATIVE NEGATIVE Final    Comment: (NOTE) The Xpert Xpress SARS-CoV-2/FLU/RSV plus assay is intended as an aid in the diagnosis of influenza from Nasopharyngeal swab  specimens and should not be used as a sole basis for treatment. Nasal washings and aspirates are unacceptable for Xpert Xpress SARS-CoV-2/FLU/RSV testing.  Fact Sheet for Patients: EntrepreneurPulse.com.au  Fact Sheet for Healthcare Providers: IncredibleEmployment.be  This test is not yet approved or cleared by the [place] FDA and has been authorized for detection and/or diagnosis of SARS-CoV-2 by FDA under an Emergency Use Authorization (EUA). This EUA will remain in effect (meaning this test can be used) for the duration of the COVID-19 declaration under Section 564(b)(1) of the Act, 21 U.S.C. section 360bbb-3(b)(1), unless the authorization is terminated or revoked.  Performed at [facility], [address] [place], [place] [address]     Time coordinating discharge: Over 30 minutes  SIGNED:  Lorella Nimrod, MD  [facility] 05/31/2020, 10:11 AM  If 7PM-7AM, please contact night-coverage [email or web address]  This record has been created using Systems analyst. Errors have been sought and corrected,but may not always be located. Such creation errors do not reflect on the standard of care.

## 2020-05-31 NOTE — Progress Notes (Signed)
[facility] SURGICAL PROGRESS NOTE (cpt [phone])  Hospital Day(s): 2.   Interval History: Patient seen and examined, no acute events or new complaints overnight. Patient reports he is doing well, no complaints, and he is ready to get his chest tube out and go home.He denies fever, chills, cough, CP, SOB. Chest tube with minimal output. He remains without an air leak. CXR reassuring with chest tub eon water seal x24 hours. He is ambulating and using IS without issue.   Review of Systems:  Constitutional: denies fever, chills  HEENT: denies cough or congestion  Respiratory: denies any shortness of breath  Cardiovascular: denies chest pain or palpitations  Gastrointestinal: denies abdominal pain, N/V, or diarrhea/and bowel function as per interval history Genitourinary: denies burning with urination or urinary frequency  Vital signs in last 24 hours: [min-max] current  Temp:  [97.6 F (36.4 C)-98.1 F (36.7 C)] 97.9 F (36.6 C) (05/11 0739) Pulse Rate:  [67-80] 80 (05/11 0739) Resp:  [16-18] 18 (05/11 0739) BP: (110-132)/(77-84) 120/83 (05/11 0739) SpO2:  [93 %-97 %] 95 % (05/11 0739)     Height: 6\' 3"  (190.5 cm) Weight: 107.5 kg BMI (Calculated): 29.62   Intake/Output last 2 shifts:  No intake/output data recorded.   Physical Exam:  Constitutional: alert, cooperative and no distress  HENT: normocephalic without obvious abnormality  Eyes: PERRL, EOM's grossly intact and symmetric  Respiratory: breathing non-labored at rest; CTAB, on [place] Cardiovascular: regular rate and sinus rhythm Chest: Chest tube to the left lateral chest wall; site is CDI; no appreciable air leak; no output - I did remove this Gastrointestinal: Soft, non-tender, non-distended, no rebound/guarding. Soft umbilical and left inguinal hernia, these are reducible.  Musculoskeletal: no edema or wounds, motor and sensation grossly intact, NT     Labs:  CBC Latest Ref Rng & Units 05/28/2020 12/01/2016  08/24/2014  WBC 4.0 - 10.5 K/uL 8.5 10.5 11.8(H)  Hemoglobin 13.0 - 17.0 g/dL 15.1 15.8 14.1  Hematocrit 39.0 - 52.0 % 44.3 47.5 43.5  Platelets 150 - 400 K/uL 240 286 333   CMP Latest Ref Rng & Units 05/28/2020 12/01/2016 06/22/2014  Glucose 70 - 99 mg/dL 122(H) 104(H) 114(H)  BUN 8 - 23 mg/dL 11 QUANTITY NOT SUFFICIENT, UNABLE TO PERFORM TEST 17  Creatinine 0.61 - 1.24 mg/dL 1.38(H) QUANTITY NOT SUFFICIENT, UNABLE TO PERFORM TEST 1.31(H)  Sodium 135 - 145 mmol/L 138 139 137  Potassium 3.5 - 5.1 mmol/L 3.9 4.1 3.6  Chloride 98 - 111 mmol/L 103 107 103  CO2 22 - 32 mmol/L 25 15(L) 27  Calcium 8.9 - 10.3 mg/dL 9.2 9.2 8.7(L)  Total Protein 6.5 - 8.1 g/dL - - 7.1  Total Bilirubin 0.3 - 1.2 mg/dL - - 1.3(H)  Alkaline Phos 38 - 126 U/L - - 63  AST 15 - 41 U/L - - 28  ALT 17 - 63 U/L - - 30     Imaging studies:   CXR (05/31/2020) personally reviewed showing trace, stable, apical left pneumothorax, stable chest tube, and radiologist report reviewed:  IMPRESSION: Stable position of left lateral chest tube with small residual left apical pneumothorax.  COPD/chronic changes.    Assessment/Plan: (ICD-10's:J93.11) 64 y.o.malewith improvement in left sided spontaneous pneumothorax s/p chest tube placement without air leak, complicated by pertinent comorbidities includingsignificant history of tobacco abuse.    - CXR shows very small, stable apical pneumothorax with chest tube to water seal. He continues to be without signs/symptoms and oxygenating appropriately. As such, I will  remove chest tube this morning at bedside. Occlusive dressing place, and instructed to leave in place for 48 hours.     - Pain control prn; antiemetics prn - Further management per primary services    - Discharge Planning: Okay for DC today, I will also place follow up with surgery for evaluation for inguinal and umbilical hernia repair.    All of the above findings and recommendations were discussed  with the patient, patient's family (wife), and the medical team, and all of patient's and family's questions were answered to their expressed satisfaction.  -- Edison Simon, PA-C [facility] 05/31/2020, 9:19 AM [phone] M-F: 7am - 4pm

## 2020-06-14 ENCOUNTER — Other Ambulatory Visit: Payer: Self-pay

## 2020-06-14 ENCOUNTER — Encounter: Payer: Self-pay | Admitting: Surgery

## 2020-06-14 ENCOUNTER — Ambulatory Visit (INDEPENDENT_AMBULATORY_CARE_PROVIDER_SITE_OTHER): Payer: Self-pay | Admitting: Surgery

## 2020-06-14 ENCOUNTER — Ambulatory Visit
Admission: RE | Admit: 2020-06-14 | Discharge: 2020-06-14 | Disposition: A | Discharge status: Home / Self Care | Payer: Self-pay | Source: Ambulatory Visit | Attending: Physician Assistant | Admitting: Physician Assistant

## 2020-06-14 VITALS — BP 145/98 | HR 100 | Temp 98.3°F | Ht 75.0 in | Wt 239.0 lb

## 2020-06-14 DIAGNOSIS — J930 Spontaneous tension pneumothorax: Secondary | ICD-10-CM

## 2020-06-14 DIAGNOSIS — J9311 Primary spontaneous pneumothorax: Secondary | ICD-10-CM

## 2020-06-14 NOTE — Patient Instructions (Signed)
We will refer you to [facility] for clearance for surgery. They will call you to schedule this.    You have requested for your Umbilical Hernia be repaired.  You will need to arrange to be off work for 1-2 weeks but will have to have a lifting restriction of no more than 15 lbs for 6 weeks following your surgery.  Follow up in 6 weeks to discuss and schedule surgery.   Umbilical Hernia, Adult A hernia is a bulge of tissue that pushes through an opening between muscles. An umbilical hernia happens in the abdomen, near the belly button (umbilicus). The hernia may contain tissues from the small intestine, large intestine, or fatty tissue covering the intestines (omentum). Umbilical hernias in adults tend to get worse over time, and they require surgical treatment. There are several types of umbilical hernias. You may have:  A hernia located just above or below the umbilicus (indirect hernia). This is the most common type of umbilical hernia in adults.  A hernia that forms through an opening formed by the umbilicus (direct hernia).  A hernia that comes and goes (reducible hernia). A reducible hernia may be visible only when you strain, lift something heavy, or cough. This type of hernia can be pushed back into the abdomen (reduced).  A hernia that traps abdominal tissue inside the hernia (incarcerated hernia). This type of hernia cannot be reduced.  A hernia that cuts off blood flow to the tissues inside the hernia (strangulated hernia). The tissues can start to die if this happens. This type of hernia requires emergency treatment.  What are the causes? An umbilical hernia happens when tissue inside the abdomen presses on a weak area of the abdominal muscles. What increases the risk? You may have a greater risk of this condition if you:  Are obese.  Have had several pregnancies.  Have a buildup of fluid inside your abdomen (ascites).  Have had surgery that weakens the  abdominal muscles.  What are the signs or symptoms? The main symptom of this condition is a painless bulge at or near the belly button. A reducible hernia may be visible only when you strain, lift something heavy, or cough. Other symptoms may include:  Dull pain.  A feeling of pressure.  Symptoms of a strangulated hernia may include:  Pain that gets increasingly worse.  Nausea and vomiting.  Pain when pressing on the hernia.  Skin over the hernia becoming red or purple.  Constipation.  Blood in the stool.  How is this diagnosed? This condition may be diagnosed based on:  A physical exam. You may be asked to cough or strain while standing. These actions increase the pressure inside your abdomen and force the hernia through the opening in your muscles. Your health care provider may try to reduce the hernia by pressing on it.  Your symptoms and medical history.  How is this treated? Surgery is the only treatment for an umbilical hernia. Surgery for a strangulated hernia is done as soon as possible. If you have a small hernia that is not incarcerated, you may need to lose weight before having surgery. Follow these instructions at home:  Lose weight, if told by your health care provider.  Do not try to push the hernia back in.  Watch your hernia for any changes in color or size. Tell your health care provider if any changes occur.  You may need to avoid activities that increase pressure on your hernia.  Do not lift  anything that is heavier than 10 lb (4.5 kg) until your health care provider says that this is safe.  Take over-the-counter and prescription medicines only as told by your health care provider.  Keep all follow-up visits as told by your health care provider. This is important. Contact a health care provider if:  Your hernia gets larger.  Your hernia becomes painful. Get help right away if:  You develop sudden, severe pain near the area of your  hernia.  You have pain as well as nausea or vomiting.  You have pain and the skin over your hernia changes color.  You develop a fever. This information is not intended to replace advice given to you by your health care provider. Make sure you discuss any questions you have with your health care provider. Document Released: 06/09/2015 Document Revised: 09/10/2015 Document Reviewed: 06/09/2015 Elsevier Interactive Patient Education  [employer].

## 2020-06-15 NOTE — Progress Notes (Signed)
Outpatient Surgical Follow Up  06/15/2020  Terry Freeman is an 65 y.o. Freeman.   Chief Complaint  Patient presents with  . New Patient (Initial Visit)    HPI: 65 year old Freeman with history of left pneumothorax from bleb.  Pneumothorax has resolved and he is doing well.  Now comes in with symptomatic ventral hernia as well as left inguinal hernia.  His pneumothorax is completely resolved both clinically and by chest x-ray.  He does have some chronic COPD. Reports that he had an appendectomy several years ago and ever since that had a bulge around his umbilicus.  He also reports having a bulge in his left inguinal area.  He experiences some mild intermittent pain occasionally from his ventral hernia.  No fevers no chills  Past Medical History:  Diagnosis Date  . Asthma   . Chronic kidney disease   . Collagen vascular disease ([place])   . COPD (chronic obstructive pulmonary disease) ([place])   . Coronary artery disease   . GERD (gastroesophageal reflux disease)   . Hypertension   . Myocardial infarction ([place])   . Pneumonia 1983  . Reflux     Past Surgical History:  Procedure Laterality Date  . APPENDECTOMY  2007  . CORONARY ANGIOPLASTY WITH STENT PLACEMENT  2012    Family History  Problem Relation Age of Onset  . Cancer - Lung Mother   . CAD Mother   . Cancer - Lung Father     Social History:  reports that he quit smoking about 15 years ago. He has a 175.00 pack-year smoking history. He has never used smokeless tobacco. He reports that he does not drink alcohol and does not use drugs.  Allergies: No Known Allergies  Medications reviewed.    ROS Full ROS performed and is otherwise negative other than what is stated in HPI   BP (!) 145/98   Pulse 100   Temp 98.3 F (36.8 C)   Ht 6\' 3"  (1.905 m)   Wt 239 lb (108.4 kg)   SpO2 97%   BMI 29.87 kg/m   Physical Exam Vitals and nursing note reviewed. Exam conducted with a chaperone present.  Constitutional:       General: He is not in acute distress.    Appearance: He is normal weight.  Eyes:     General:        Right eye: No discharge.        Left eye: No discharge.  Cardiovascular:     Rate and Rhythm: Normal rate and regular rhythm.  Pulmonary:     Effort: Pulmonary effort is normal. No respiratory distress.     Breath sounds: Normal breath sounds. No stridor. No wheezing or rhonchi.  Abdominal:     General: Abdomen is flat. There is no distension.     Palpations: There is no mass.     Tenderness: There is no abdominal tenderness. There is no guarding or rebound.     Hernia: A hernia is present.     Comments: There is evidence of a 2 and half centimeter periumbilical incisional hernia reducible.  There is also evidence of a reducible left inguinal hernia.  No peritonitis.  Musculoskeletal:     Cervical back: Normal range of motion and neck supple. No rigidity.  Skin:    General: Skin is warm and dry.  Neurological:     General: No focal deficit present.     Mental Status: He is alert and oriented to person, place, and  time.  Psychiatric:        Mood and Affect: Mood normal.        Behavior: Behavior normal.        Thought Content: Thought content normal.        Judgment: Judgment normal.        No results found for this or any previous visit (from the past Terry hour(s)). DG Chest 2 View  Result Date: 06/15/2020 CLINICAL DATA:  65 year old Freeman with history of pneumothorax diagnosed on May 8th. EXAM: CHEST - 2 VIEW COMPARISON:  Chest x-ray 05/31/2020. FINDINGS: Previously noted left-sided chest tube has been removed. No definite residual pneumothorax appreciated on today's examination. No acute consolidative airspace disease. No pleural effusions. Lung volumes are increased with emphysematous changes. Chronic bilateral apical pleuroparenchymal thickening and architectural distortion (right greater than left), most likely to reflect areas of chronic post infectious or inflammatory  scarring. Medial left upper lobe nodule better demonstrated on prior chest CT 05/29/2020. No evidence of pulmonary edema. Heart size is normal. Upper mediastinal contours are within normal limits. Atherosclerotic calcifications in the thoracic aorta. IMPRESSION: 1. Status post left chest tube removal with no residual pneumothorax. 2. Emphysema. 3. Aortic atherosclerosis. Electronically Signed   By: Vinnie Langton M.D.   On: 06/15/2020 09:32    Assessment/Plan: 65 year old Freeman with history of left pneumothorax from bleb.  Pneumothorax has resolved and he is doing well.  Now comes in with symptomatic ventral hernia as well as left inguinal hernia.  Patient wishes to have this repaired and I do think that he would be a good candidate for robotic surgery.  He wishes to wait for at least a month and a half or so.  In the interim he I will like him to see pulmonary medicine physician so he can be optimized from a COPD perspective.  Please note that I spent at least 40 minutes in this encounter with Greater than 50%   spent in counseling/coordination of care RTC 6 weeks at that time we will  Schedule incisional ventral and LIH repair robotically  Caroleen Hamman, MD [place] [facility]

## 2020-07-26 ENCOUNTER — Other Ambulatory Visit: Payer: Self-pay

## 2020-07-26 ENCOUNTER — Ambulatory Visit (INDEPENDENT_AMBULATORY_CARE_PROVIDER_SITE_OTHER): Payer: Self-pay | Admitting: Surgery

## 2020-07-26 ENCOUNTER — Encounter: Payer: Self-pay | Admitting: Surgery

## 2020-07-26 VITALS — BP 137/87 | HR 83 | Temp 97.8°F | Ht 75.0 in | Wt 241.0 lb

## 2020-07-26 DIAGNOSIS — K4090 Unilateral inguinal hernia, without obstruction or gangrene, not specified as recurrent: Secondary | ICD-10-CM

## 2020-07-26 DIAGNOSIS — K432 Incisional hernia without obstruction or gangrene: Secondary | ICD-10-CM

## 2020-07-26 NOTE — Progress Notes (Signed)
Outpatient Surgical Follow Up  07/26/2020  Terry Freeman is an 65 y.o. male.   Chief Complaint  Patient presents with   Follow-up    Inguinal and umbilical hernia    HPI:   65 year old male with history of left pneumothorax from bleb.  Pneumothorax has resolved and he is doing well.  Now comes in with symptomatic ventral hernia as well as left inguinal hernia. His pneumothorax is completely resolved both clinically and by chest x-ray.  He does have some chronic COPD. Reports that he had an appendectomy several years ago and ever since that had a bulge around his umbilicus. Have already personally reviewed his CT scan showing also evidence of a paraesophageal hernia type III.  He currently states that he has no symptoms he is able to swallow well and he is well controlled with PPI.  He does have upcoming PFTs tomorrow.     Past Medical History:  Diagnosis Date   Asthma    Chronic kidney disease    Collagen vascular disease (H[facility])    COPD (chronic obstructive pulmonary disease) ([place])    Coronary artery disease    GERD (gastroesophageal reflux disease)    Hypertension    Myocardial infarction ([place])    Pneumonia 1983   Reflux     Past Surgical History:  Procedure Laterality Date   APPENDECTOMY  2007   CORONARY ANGIOPLASTY WITH STENT PLACEMENT  2012    Family History  Problem Relation Age of Onset   Cancer - Lung Mother    CAD Mother    Cancer - Lung Father     Social History:  reports that he quit smoking about 15 years ago. He has a 175.00 pack-year smoking history. He has never used smokeless tobacco. He reports that he does not drink alcohol and does not use drugs.  Allergies: No Known Allergies  Medications reviewed.    ROS Full ROS performed and is otherwise negative other than what is stated in HPI   BP 137/87   Pulse 83   Temp 97.8 F (36.6 C) (Oral)   Ht 6\' 3"  (1.905 m)   Wt 241 lb (109.3 kg)   SpO2 96%   BMI 30.12 kg/m   Physical Exam Vitals  and nursing note reviewed. Exam conducted with a chaperone present.  Constitutional:      General: He is not in acute distress.    Appearance: Normal appearance. He is not ill-appearing.  Cardiovascular:     Rate and Rhythm: Normal rate and regular rhythm.     Heart sounds: No murmur heard. Pulmonary:     Effort: Pulmonary effort is normal. No respiratory distress.     Breath sounds: Normal breath sounds. No stridor.  Abdominal:     General: Abdomen is flat. There is no distension.     Palpations: Abdomen is soft. There is no mass.     Tenderness: There is no guarding or rebound.     Hernia: A hernia is present.     Comments: Large reducible left inguinal hernia and reducible incisional pain umbilical hernia.  Nontender to palpation of peritonitis  Musculoskeletal:        General: No swelling or tenderness. Normal range of motion.     Cervical back: Normal range of motion and neck supple.  Skin:    General: Skin is warm and dry.     Capillary Refill: Capillary refill takes less than 2 seconds.  Neurological:     General: No focal deficit  present.     Mental Status: He is alert and oriented to person, place, and time.  Psychiatric:        Mood and Affect: Mood normal.        Behavior: Behavior normal.        Thought Content: Thought content normal.        Judgment: Judgment normal.     Assessment/Plan: 65 year old male with symptomatic incisional hernia and Left  inguinal hernia.  Cussed with the patient in detail about [place] condition for repair.  He seems to be compensated from a pulmonary perspective.  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  He is to complete PFTs tomorrow.  He is not in a rush to getting this surgery schedule and wishes to have it done during the fall.  Currently there is no evidence of impending alarming signs that would mandate surgical intervention before  that.  I do think that he will be a reasonable candidate for robotic approach.  Extensive counseling provided.  Greater than 50% of the 45 minutes  visit was spent in counseling/coordination of care   Caroleen Hamman, MD [place] [facility]geon

## 2020-07-26 NOTE — Patient Instructions (Addendum)
If you have any concerns or questions, please feel free to call our office. See follow up appointment below.    Umbilical Hernia, Adult  A hernia is a bulge of tissue that pushes through an opening between muscles. An umbilical hernia happens in the abdomen, near the belly button (umbilicus). The hernia may contain tissues from the small intestine, large intestine, or fatty tissue covering the intestines (omentum). Umbilical hernias in adults tend to get worse over time, and they requiresurgical treatment. There are several types of umbilical hernias. You may have: A hernia located just above or below the umbilicus (indirect hernia). This is the most common type of umbilical hernia in adults. A hernia that forms through an opening formed by the umbilicus (direct hernia). A hernia that comes and goes (reducible hernia). A reducible hernia may be visible only when you strain, lift something heavy, or cough. This type of hernia can be pushed back into the abdomen (reduced). A hernia that traps abdominal tissue inside the hernia (incarcerated hernia). This type of hernia cannot be reduced. A hernia that cuts off blood flow to the tissues inside the hernia (strangulated hernia). The tissues can start to die if this happens. This type of hernia requires emergency treatment. What are the causes? An umbilical hernia happens when tissue inside the abdomen presses on a weakarea of the abdominal muscles. What increases the risk? You may have a greater risk of this condition if you: Are obese. Have had several pregnancies. Have a buildup of fluid inside your abdomen (ascites). Have had surgery that weakens the abdominal muscles. What are the signs or symptoms? The main symptom of this condition is a painless bulge at or near the belly button. A reducible hernia may be visible only when you strain, lift something heavy, or cough. Other symptoms may include: Dull pain. A feeling of pressure. Symptoms of a  strangulated hernia may include: Pain that gets increasingly worse. Nausea and vomiting. Pain when pressing on the hernia. Skin over the hernia becoming red or purple. Constipation. Blood in the stool. How is this diagnosed? This condition may be diagnosed based on: A physical exam. You may be asked to cough or strain while standing. These actions increase the pressure inside your abdomen and force the hernia through the opening in your muscles. Your health care provider may try to reduce the hernia by pressing on it. Your symptoms and medical history. How is this treated? Surgery is the only treatment for an umbilical hernia. Surgery for a strangulated hernia is done as soon as possible. If you have a small herniathat is not incarcerated, you may need to lose weight before having surgery. Follow these instructions at home: Lose weight, if told by your health care provider. Do not try to push the hernia back in. Watch your hernia for any changes in color or size. Tell your health care provider if any changes occur. You may need to avoid activities that increase pressure on your hernia. Do not lift anything that is heavier than 10 lb (4.5 kg) until your health care provider says that this is safe. Take over-the-counter and prescription medicines only as told by your health care provider. Keep all follow-up visits as told by your health care provider. This is important. Contact a health care provider if: Your hernia gets larger. Your hernia becomes painful. Get help right away if: You develop sudden, severe pain near the area of your hernia. You have pain as well as nausea or vomiting. You  have pain and the skin over your hernia changes color. You develop a fever. This information is not intended to replace advice given to you by your health care provider. Make sure you discuss any questions you have with your healthcare provider. Document Revised: 02/19/2017 Document Reviewed:  07/08/2016 Elsevier Patient Education  2021 [employer].    Inguinal Hernia, Adult An inguinal hernia is when fat or your intestines push through a weak spot in a muscle where your leg meets your lower belly (groin). This causes a bulge. This kind of hernia could also be: In your scrotum, if you are male. In folds of skin around your vagina, if you are male. There are three types of inguinal hernias: Hernias that can be pushed back into the belly (are reducible). This type rarely causes pain. Hernias that cannot be pushed back into the belly (are incarcerated). Hernias that cannot be pushed back into the belly and lose their blood supply (are strangulated). This type needs emergency surgery. What are the causes? This condition is caused by having a weak spot in the muscles or tissues in your groin. This develops over time. The hernia may poke through the weak spot when you strain your lower belly muscles all of a sudden, such as when you: Lift a heavy object. Strain to poop (have a bowel movement). Trouble pooping (constipation) can lead to straining. Cough. What increases the risk? This condition is more likely to develop in: Males. Pregnant females. People who: Are overweight. Work in jobs that require long periods of standing or heavy lifting. Have had an inguinal hernia before. Smoke or have lung disease. These factors can lead to long-term (chronic) coughing. What are the signs or symptoms? Symptoms may depend on the size of the hernia. Often, a small hernia has no symptoms. Symptoms of a larger hernia may include: A bulge in the groin area. This is easier to see when standing. You might not be able to see it when you are lying down. Pain or burning in the groin. This may get worse when you lift, strain, or cough. A dull ache or a feeling of pressure in the groin. An abnormal bulge in the scrotum, in males. Symptoms of a strangulated inguinal hernia may include: A bulge in  your groin that is very painful and tender to the touch. A bulge that turns red or purple. Fever, feeling like you may vomit (nausea), and vomiting. Not being able to poop or to pass gas. How is this treated? Treatment depends on the size of your hernia and whether you have symptoms. If you do not have symptoms, your doctor may have you watch your hernia carefully and have you come in for follow-up visits. If your hernia is large or if youhave symptoms, you may need surgery to repair the hernia. Follow these instructions at home: Lifestyle Avoid lifting heavy objects. Avoid standing for long amounts of time. Do not smoke or use any products that contain nicotine or tobacco. If you need help quitting, ask your doctor. Stay at a healthy weight. Prevent trouble pooping You may need to take these actions to prevent or treat trouble pooping: Drink enough fluid to keep your pee (urine) pale yellow. Take over-the-counter or prescription medicines. Eat foods that are high in fiber. These include beans, whole grains, and fresh fruits and vegetables. Limit foods that are high in fat and sugar. These include fried or sweet foods. General instructions You may try to push your hernia back in place  by very gently pressing on it when you are lying down. Do not try to push the bulge back in if it will not go in easily. Watch your hernia for any changes in shape, size, or color. Tell your doctor if you see any changes. Take over-the-counter and prescription medicines only as told by your doctor. Keep all follow-up visits. Contact a doctor if: You have a fever or chills. You have new symptoms. Your symptoms get worse. Get help right away if: You have pain in your groin that gets worse all of a sudden. You have a bulge in your groin that: Gets bigger all of a sudden, and it does not get smaller after that. Turns red or purple. Is painful when you touch it. You are a male, and you have: Sudden pain in  your scrotum. A sudden change in the size of your scrotum. You cannot push the hernia back in place by very gently pressing on it when you are lying down. You feel like you may vomit, and that feeling does not go away. You keep vomiting. You have a fast heartbeat. You cannot poop or pass gas. These symptoms may be an emergency. Get help right away. Call your local emergency services (911 in the [place] Do not wait to see if the symptoms will go away. Do not drive yourself to the hospital. Summary An inguinal hernia is when fat or your intestines push through a weak spot in a muscle where your leg meets your lower belly (groin). This causes a bulge. If you do not have symptoms, you may not need treatment. If you have symptoms or a large hernia, you may need surgery. Avoid lifting heavy objects. Also, avoid standing for long amounts of time. Do not try to push the bulge back in if it will not go in easily. This information is not intended to replace advice given to you by your health care provider. Make sure you discuss any questions you have with your healthcare provider. Document Revised: 09/07/2019 Document Reviewed: 09/07/2019 Elsevier Patient Education  2022 [employer].

## 2020-08-02 ENCOUNTER — Institutional Professional Consult (permissible substitution): Payer: Self-pay | Admitting: Internal Medicine

## 2020-09-18 ENCOUNTER — Ambulatory Visit: Payer: Self-pay | Admitting: Surgery

## 2020-10-16 ENCOUNTER — Ambulatory Visit: Payer: Self-pay | Admitting: Surgery

## 2020-11-13 ENCOUNTER — Ambulatory Visit: Payer: Self-pay | Admitting: Surgery

## 2021-10-11 ENCOUNTER — Telehealth: Payer: Self-pay

## 2021-10-11 NOTE — Telephone Encounter (Signed)
Received referral for [facility]. Patient had participated in [facility] in the [facility] program and completed his last scan in 2021 with documented non-smoker for 15 years.  Patient quit smoking in 2006 and is no longer eligible for [facility] LCDT as non-smoker for over 15 years.  Future CT chest would be per recommendation of provider as diagnostic exam.  Routed to referring provider for update.

## 2022-10-17 ENCOUNTER — Other Ambulatory Visit: Payer: Self-pay | Admitting: Pulmonary Disease

## 2022-10-17 DIAGNOSIS — Z122 Encounter for screening for malignant neoplasm of respiratory organs: Secondary | ICD-10-CM

## 2022-10-30 ENCOUNTER — Other Ambulatory Visit: Payer: Self-pay | Admitting: Pulmonary Disease

## 2022-10-30 ENCOUNTER — Ambulatory Visit
Admission: RE | Admit: 2022-10-30 | Discharge: 2022-10-30 | Disposition: A | Discharge status: Home / Self Care | Payer: Medicare Other | Source: Ambulatory Visit | Attending: Pulmonary Disease | Admitting: Pulmonary Disease

## 2022-10-30 DIAGNOSIS — Z122 Encounter for screening for malignant neoplasm of respiratory organs: Secondary | ICD-10-CM

## 2022-11-04 ENCOUNTER — Ambulatory Visit
Admission: RE | Admit: 2022-11-04 | Discharge: 2022-11-04 | Disposition: A | Discharge status: Home / Self Care | Payer: Medicare Other | Source: Ambulatory Visit | Attending: Pulmonary Disease | Admitting: Pulmonary Disease

## 2022-11-04 DIAGNOSIS — I700 Atherosclerosis of aorta: Secondary | ICD-10-CM | POA: Insufficient documentation

## 2022-11-04 DIAGNOSIS — R918 Other nonspecific abnormal finding of lung field: Secondary | ICD-10-CM | POA: Insufficient documentation

## 2022-11-04 DIAGNOSIS — Z122 Encounter for screening for malignant neoplasm of respiratory organs: Secondary | ICD-10-CM | POA: Insufficient documentation

## 2022-11-04 DIAGNOSIS — K8020 Calculus of gallbladder without cholecystitis without obstruction: Secondary | ICD-10-CM | POA: Diagnosis not present

## 2022-11-04 DIAGNOSIS — K449 Diaphragmatic hernia without obstruction or gangrene: Secondary | ICD-10-CM | POA: Diagnosis not present

## 2022-11-04 DIAGNOSIS — J439 Emphysema, unspecified: Secondary | ICD-10-CM | POA: Insufficient documentation

## 2022-12-10 IMAGING — CR DG CHEST 2V
2 series · 2 of 2 positions shown · non-contrast
Comparison: Prior chest x-ray 08/24/2014

CLINICAL DATA: Chest pain

EXAM:
CHEST - 2 VIEW

[chest pa]
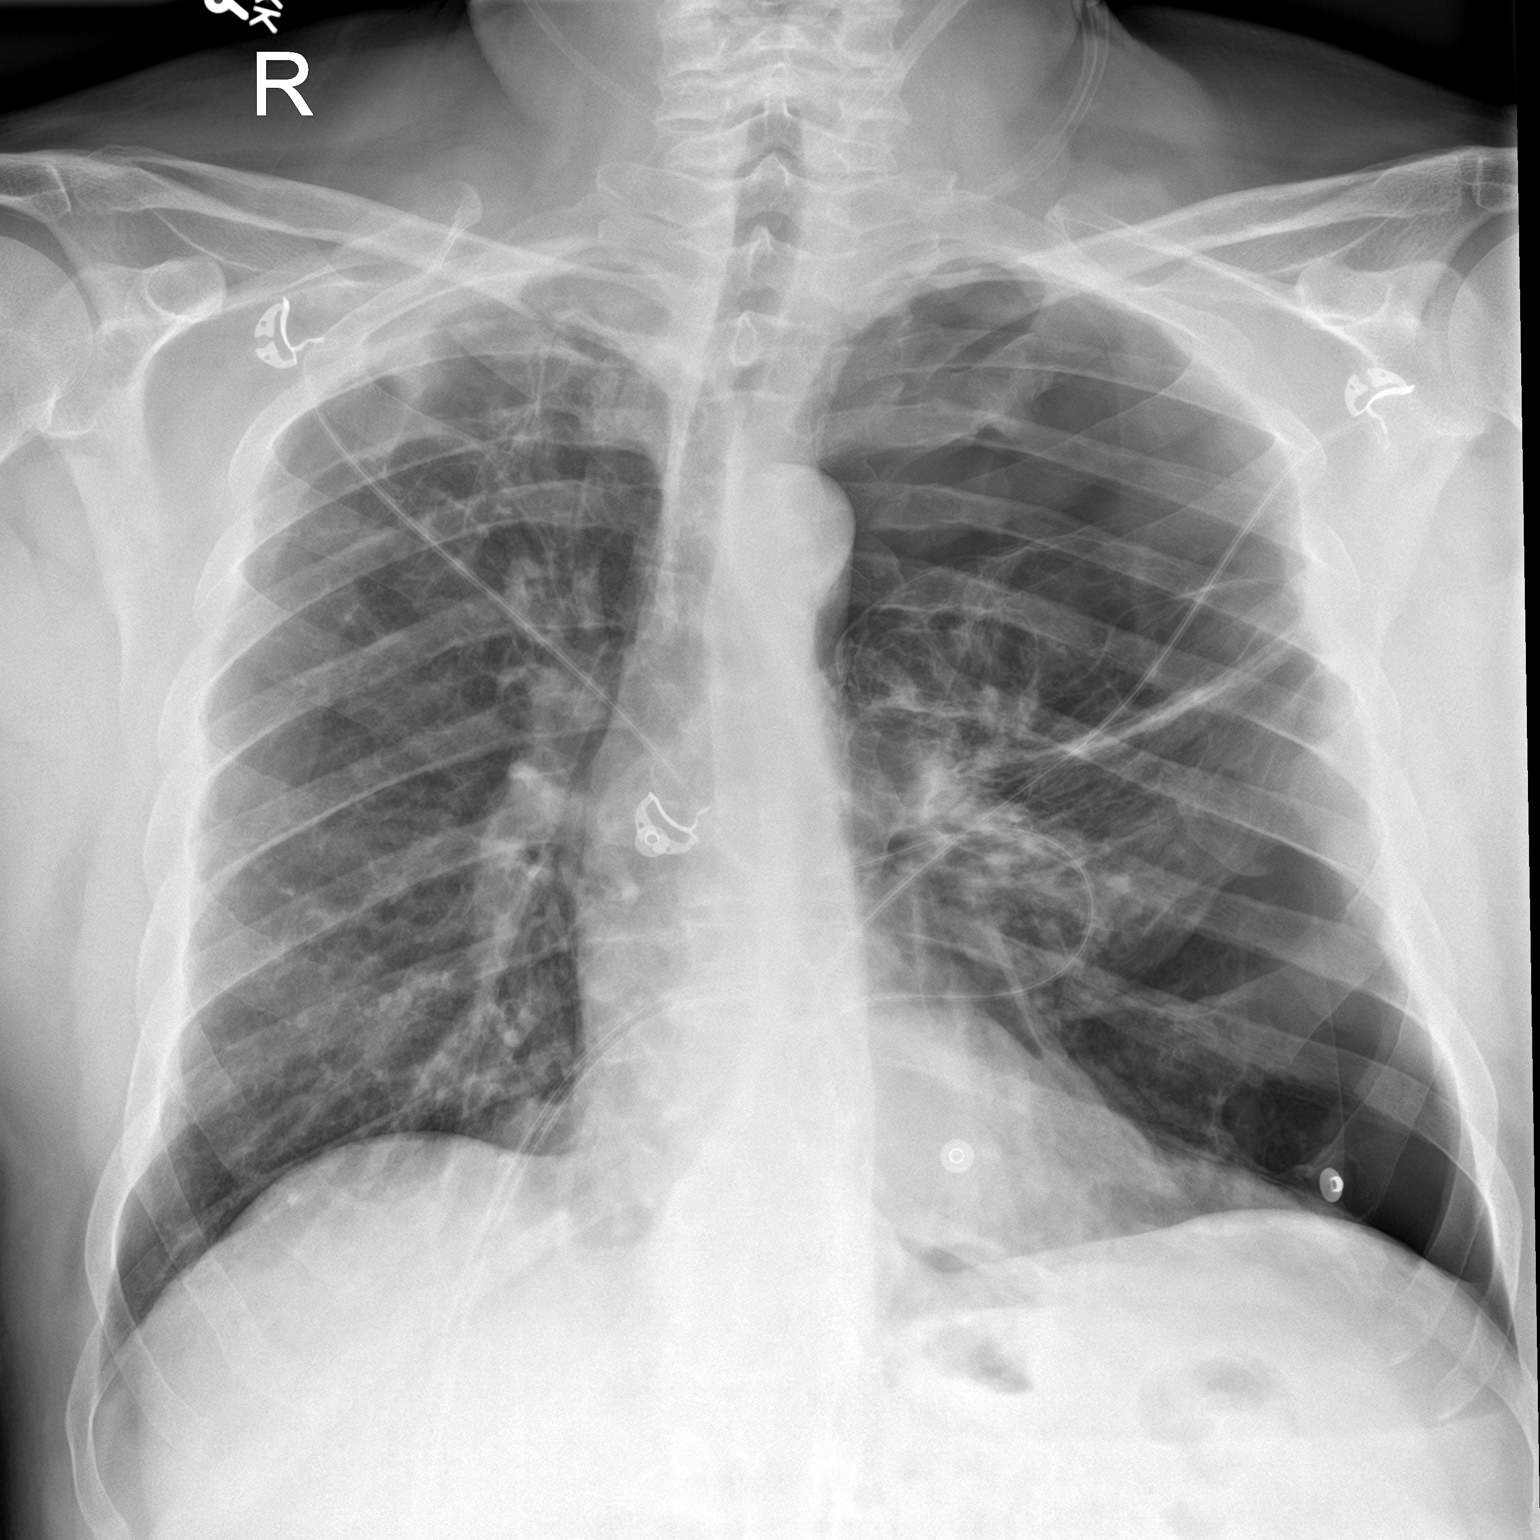

[chest lat]
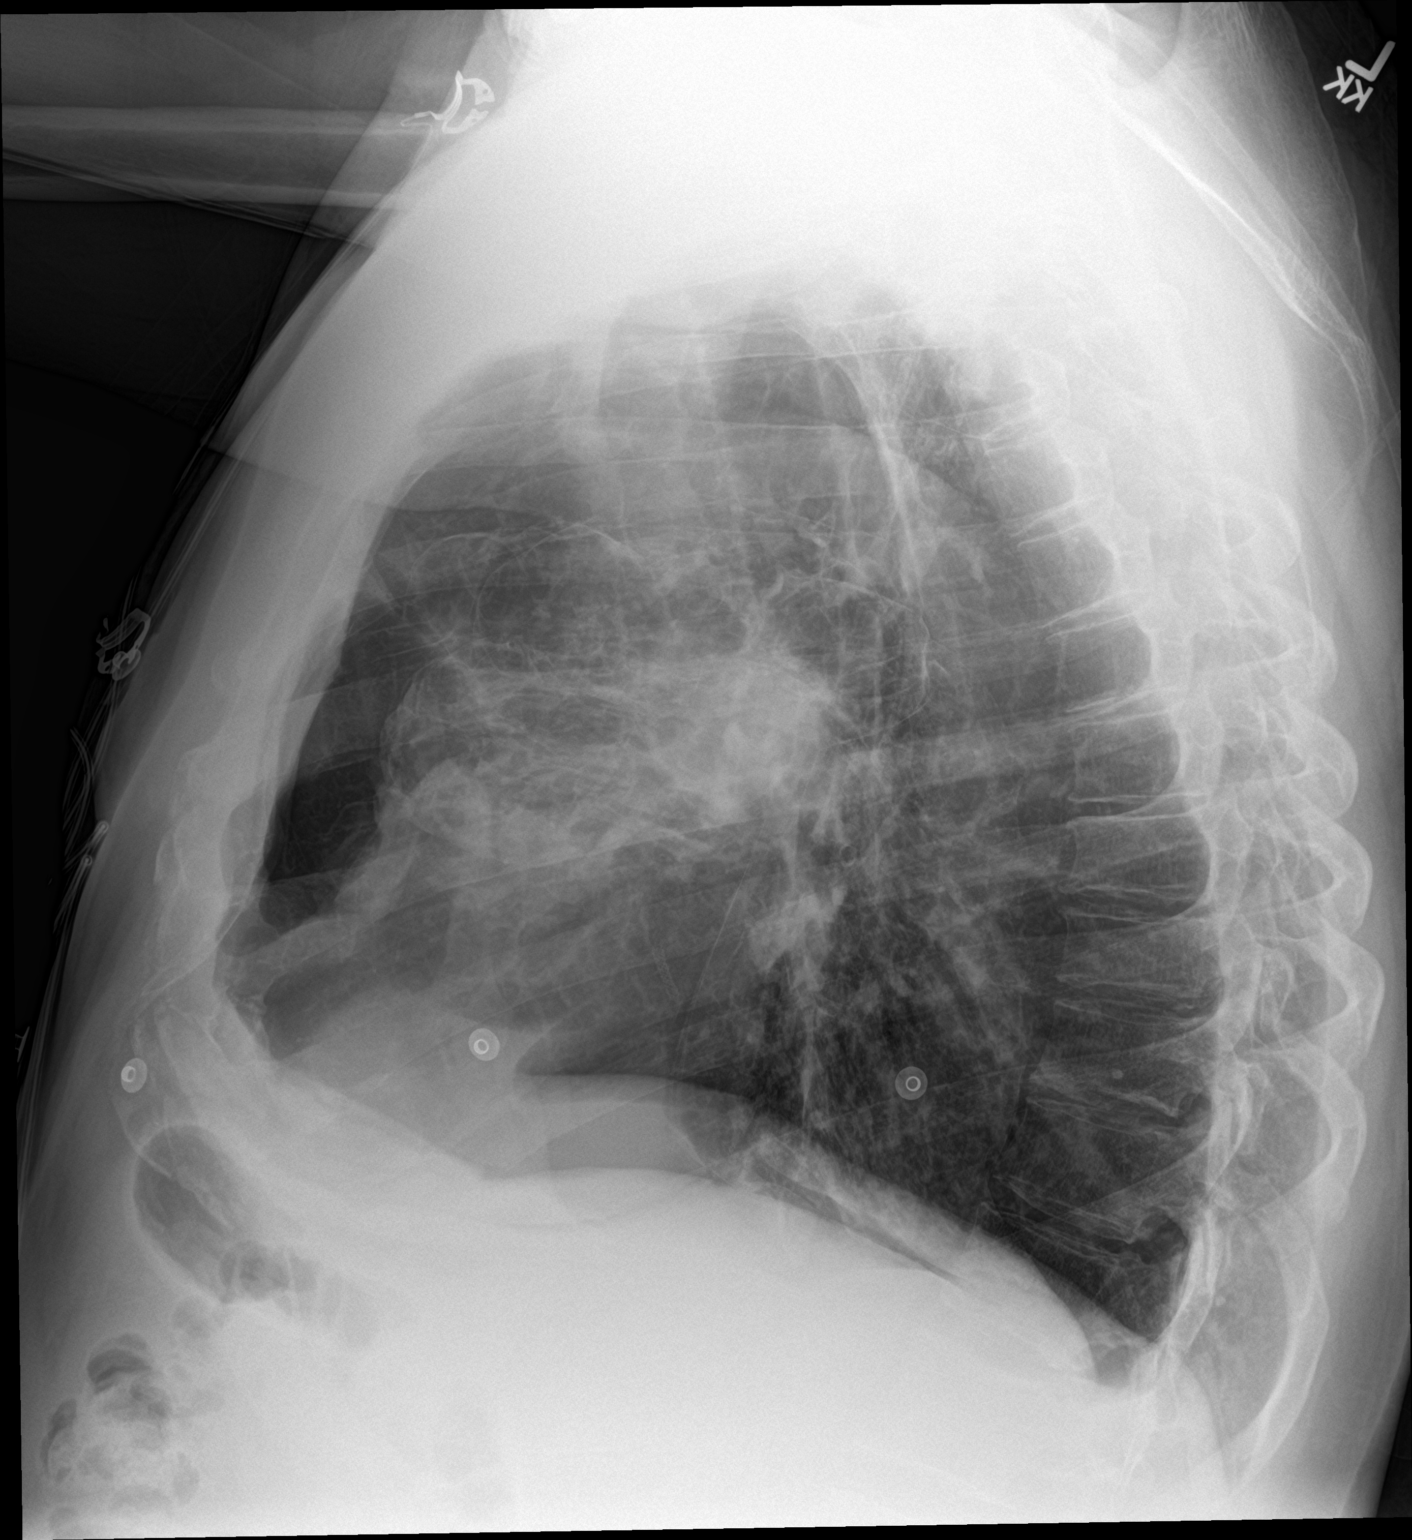

[2 of 2 positions shown; findings below may reference images not displayed]

FINDINGS: Moderate to large left-sided pneumothorax likely secondary to
rupture of a large bleb in the superolateral aspect of the chest
seen on prior imaging. Tethering of the lateral mid lung to the
pleural surface is noted. Cardiac and mediastinal contours are
within normal limits. No evidence of right-sided pneumothorax.
Advanced emphysematous and chronic bronchitic changes again noted.
No acute osseous abnormality.
IMPRESSION: Moderate to large left-sided pneumothorax likely secondary to
rupture of a large bleb. There is evidence of tethering of the
lateral mid lung to the pleural surface.

Advanced emphysema and chronic bronchitic changes.

## 2022-12-11 IMAGING — CT CT CHEST W/O CM
2 of 4 series · 15 of 36 positions shown, 18 images · non-contrast
Comparison: Chest x-ray from May 29, 2020.

CLINICAL DATA: Follow-up pneumothorax is in a 64-year-old male.

EXAM:
CT CHEST WITHOUT CONTRAST
TECHNIQUE: Multidetector CT imaging of the chest was performed following the
standard protocol without IV contrast.

[Series 2: thorax · axial · 0.82mm/px · z∈[-626,-320]mm · 12 of 179 slices shown, 15 images]
[im 13/179  mediastinal]
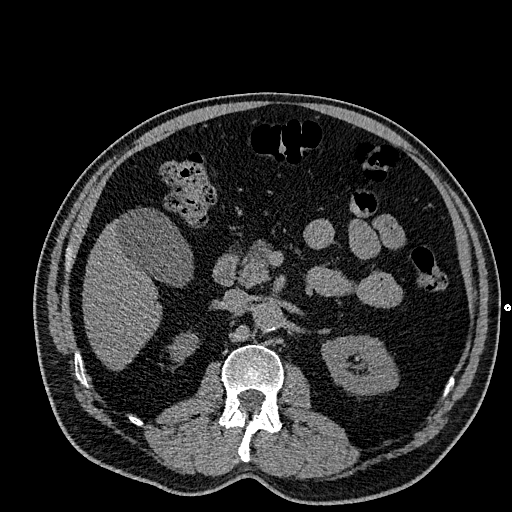
[im 13/179  lung]
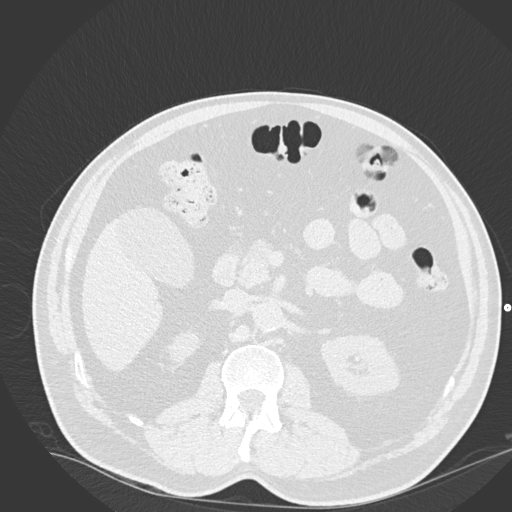
[im 26/179  lung]
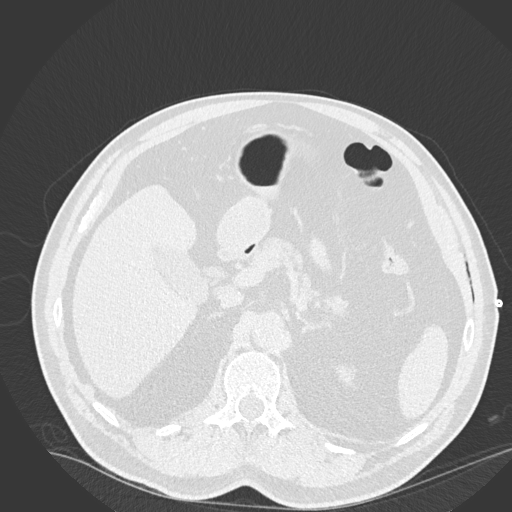
[im 39/179  lung]
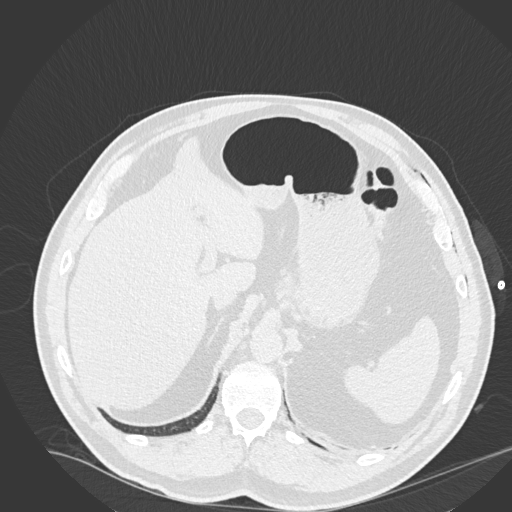
[im 51/179  lung]
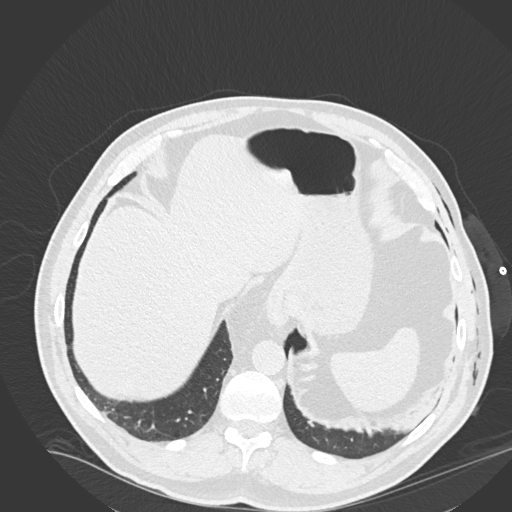
[im 64/179  mediastinal]
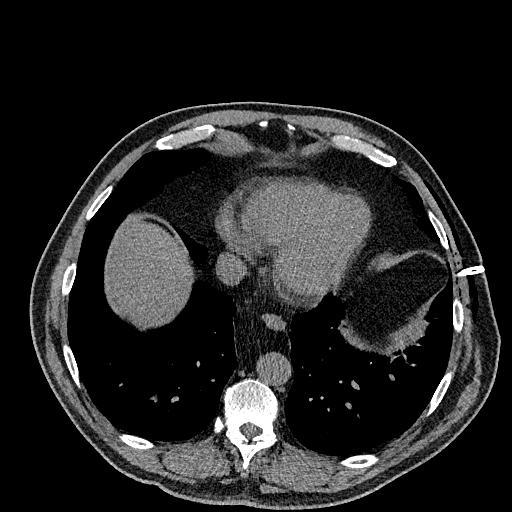
[im 64/179  lung]
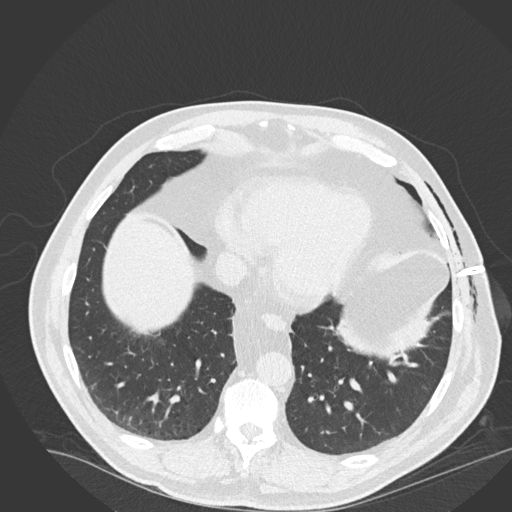
[im 77/179  lung]
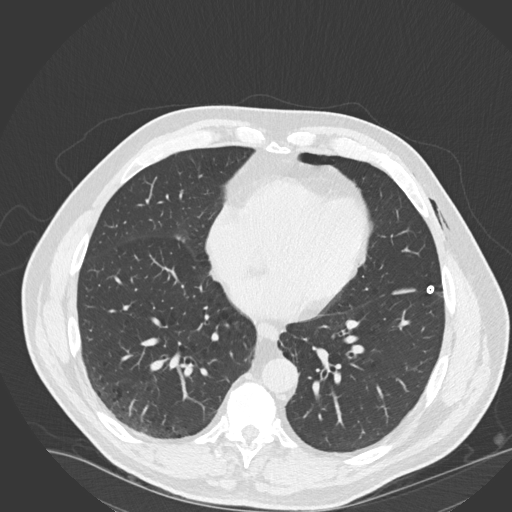
[im 102/179  lung]
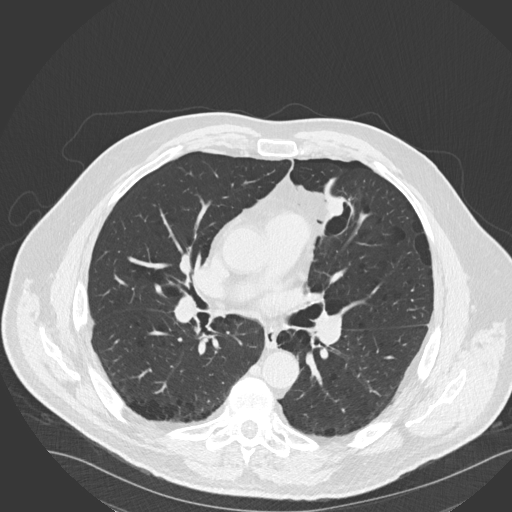
[im 115/179  lung]
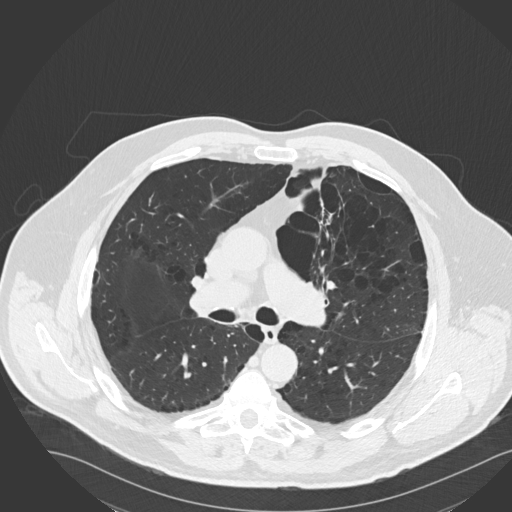
[im 128/179  mediastinal]
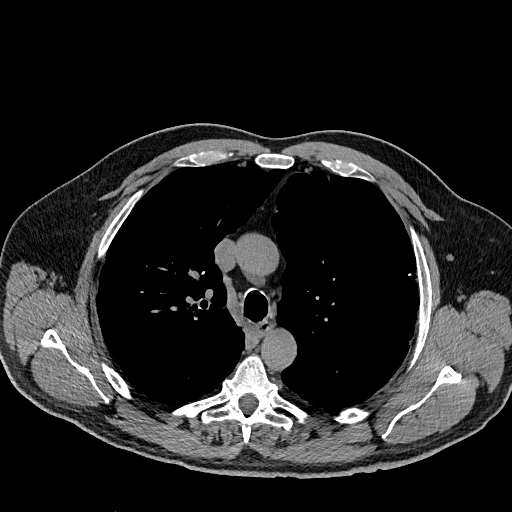
[im 128/179  lung]
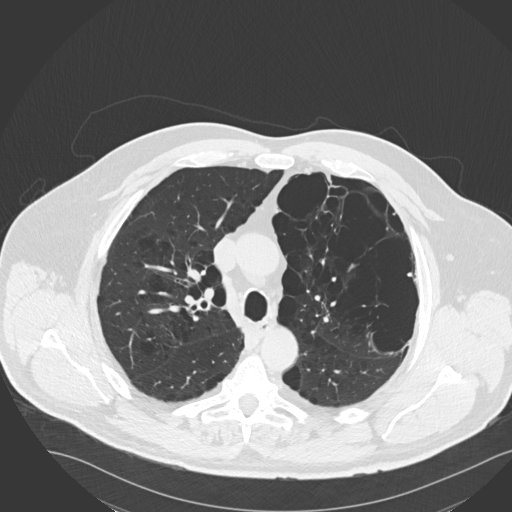
[im 140/179  lung]
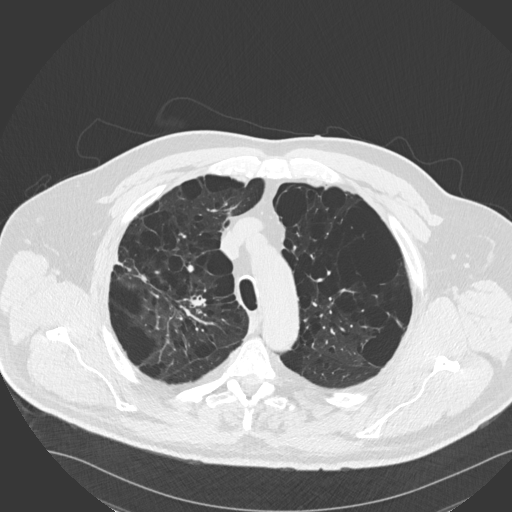
[im 153/179  lung]
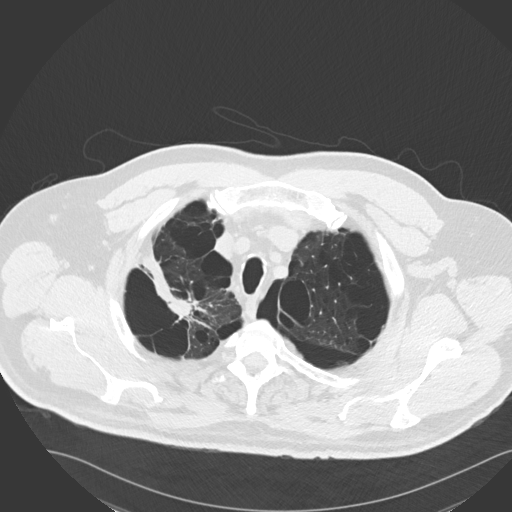
[im 166/179  lung]
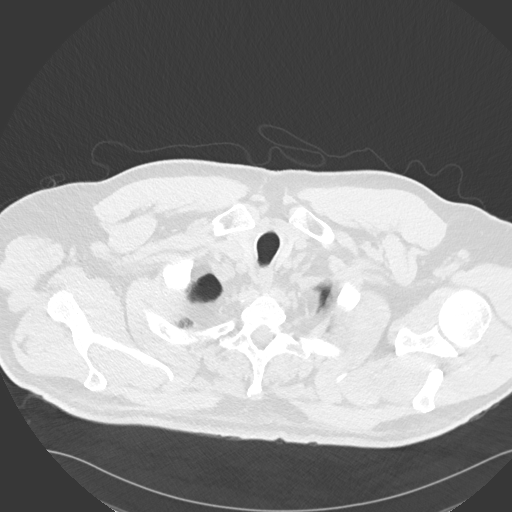

[Series 5: coronal · coronal · 0.72mm/px · 3 of 173 slices shown]
[im 35/173  lung]
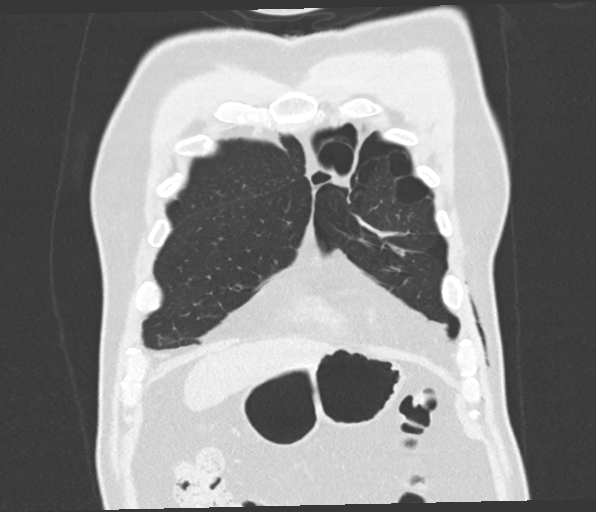
[im 69/173  lung]
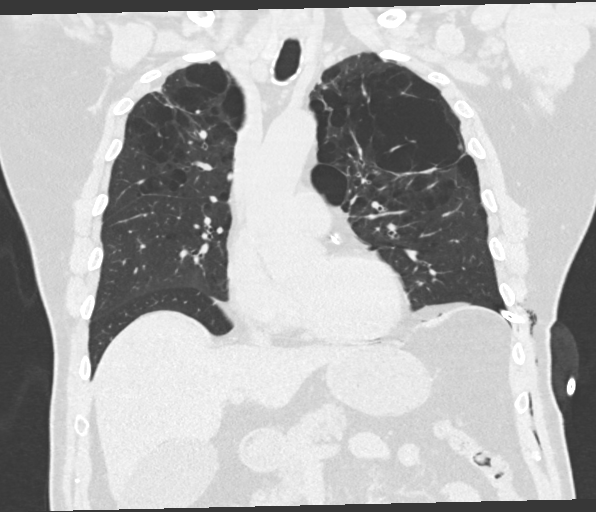
[im 104/173  lung]
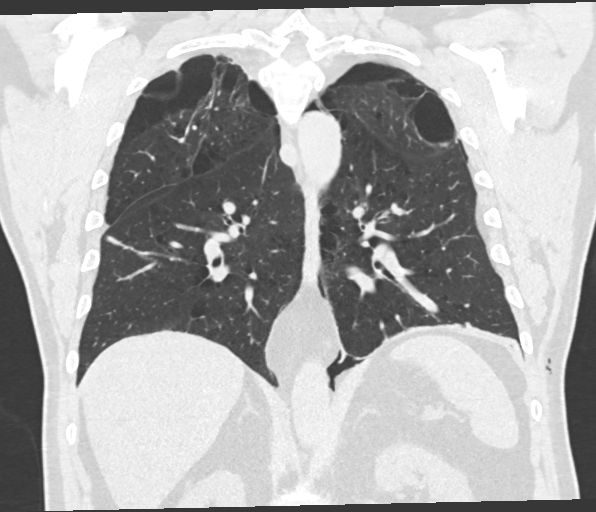

[15 of 36 positions shown; findings below may reference images not displayed]

FINDINGS: Cardiovascular: Calcifications in the thoracic aorta. No signs of
aneurysmal dilation. Heart size normal without pericardial effusion.
Two vessel coronary artery calcification. No pericardial effusion
with normal heart size. Central pulmonary vasculature is normal
caliber. Limited assessment of cardiovascular structures given lack
of intravenous contrast.

Mediastinum/Nodes: Esophagus grossly normal. No axillary, hilar or
mediastinal lymphadenopathy.

Lungs/Pleura: Juxta cardiac nodularity measuring 11 mm along the
LEFT mediastinal border is quite similar to previous imaging. There
is a LEFT-sided thoracostomy tube in place entering via lateral LEFT
chest. This passes into the major fissure and terminates along the
lateral chest wall. Small pneumothorax with apical and anterior
component, less than 10%. Signs of pulmonary emphysema which are
similar to prior imaging. Small amount of subcutaneous emphysema in
the LEFT-lateral chest wall.

RIGHT chest with similar pleural and parenchymal scarring in the
RIGHT upper lobe. Small nodules adjacent to bandlike scarring (image
32/4) 2 adjacent nodules measuring 6 and 8 mm. Another small nodule
seen posteriorly in the RIGHT upper lobe amidst bullous changes
measuring 6 mm on image 34 of series 4 also unchanged. Airways are
patent.

Upper Abdomen: Low-density focus along the lateral margin of the
RIGHT hemi liver is unchanged. Suspect background hepatic steatosis,
mild. Cholelithiasis. Visualized portions of the gallbladder
otherwise unremarkable. Visualized pancreas, spleen, adrenal glands
and kidneys are unremarkable. No acute upper abdominal process.
Small hiatal hernia.

Musculoskeletal: No acute bone finding or destructive bone process.
Spinal degenerative changes.
IMPRESSION: 1. LEFT-sided thoracostomy tube in place, pigtail type catheter with
small, suspect less than 10% pneumothorax but with associated
subcutaneous emphysema.
2. Bullous disease in the apices with stable appearance of pleural
and parenchymal scarring and nodular changes in the RIGHT upper
lobe.
3. Stable appearance of nodular changes along the LEFT mediastinal
border.

## 2022-12-12 IMAGING — CR DG CHEST 2V
1 series · 2 of 2 positions shown · non-contrast
Comparison: Chest CT and portable chest yesterday.

CLINICAL DATA: 64-year-old male with emphysema, spontaneous left
pneumothorax, chest tube.

EXAM:
CHEST - 2 VIEW

[Series 1: dg chest 2 view · 0.14mm/px · 2 of 2 slices shown]
[im 1/2]
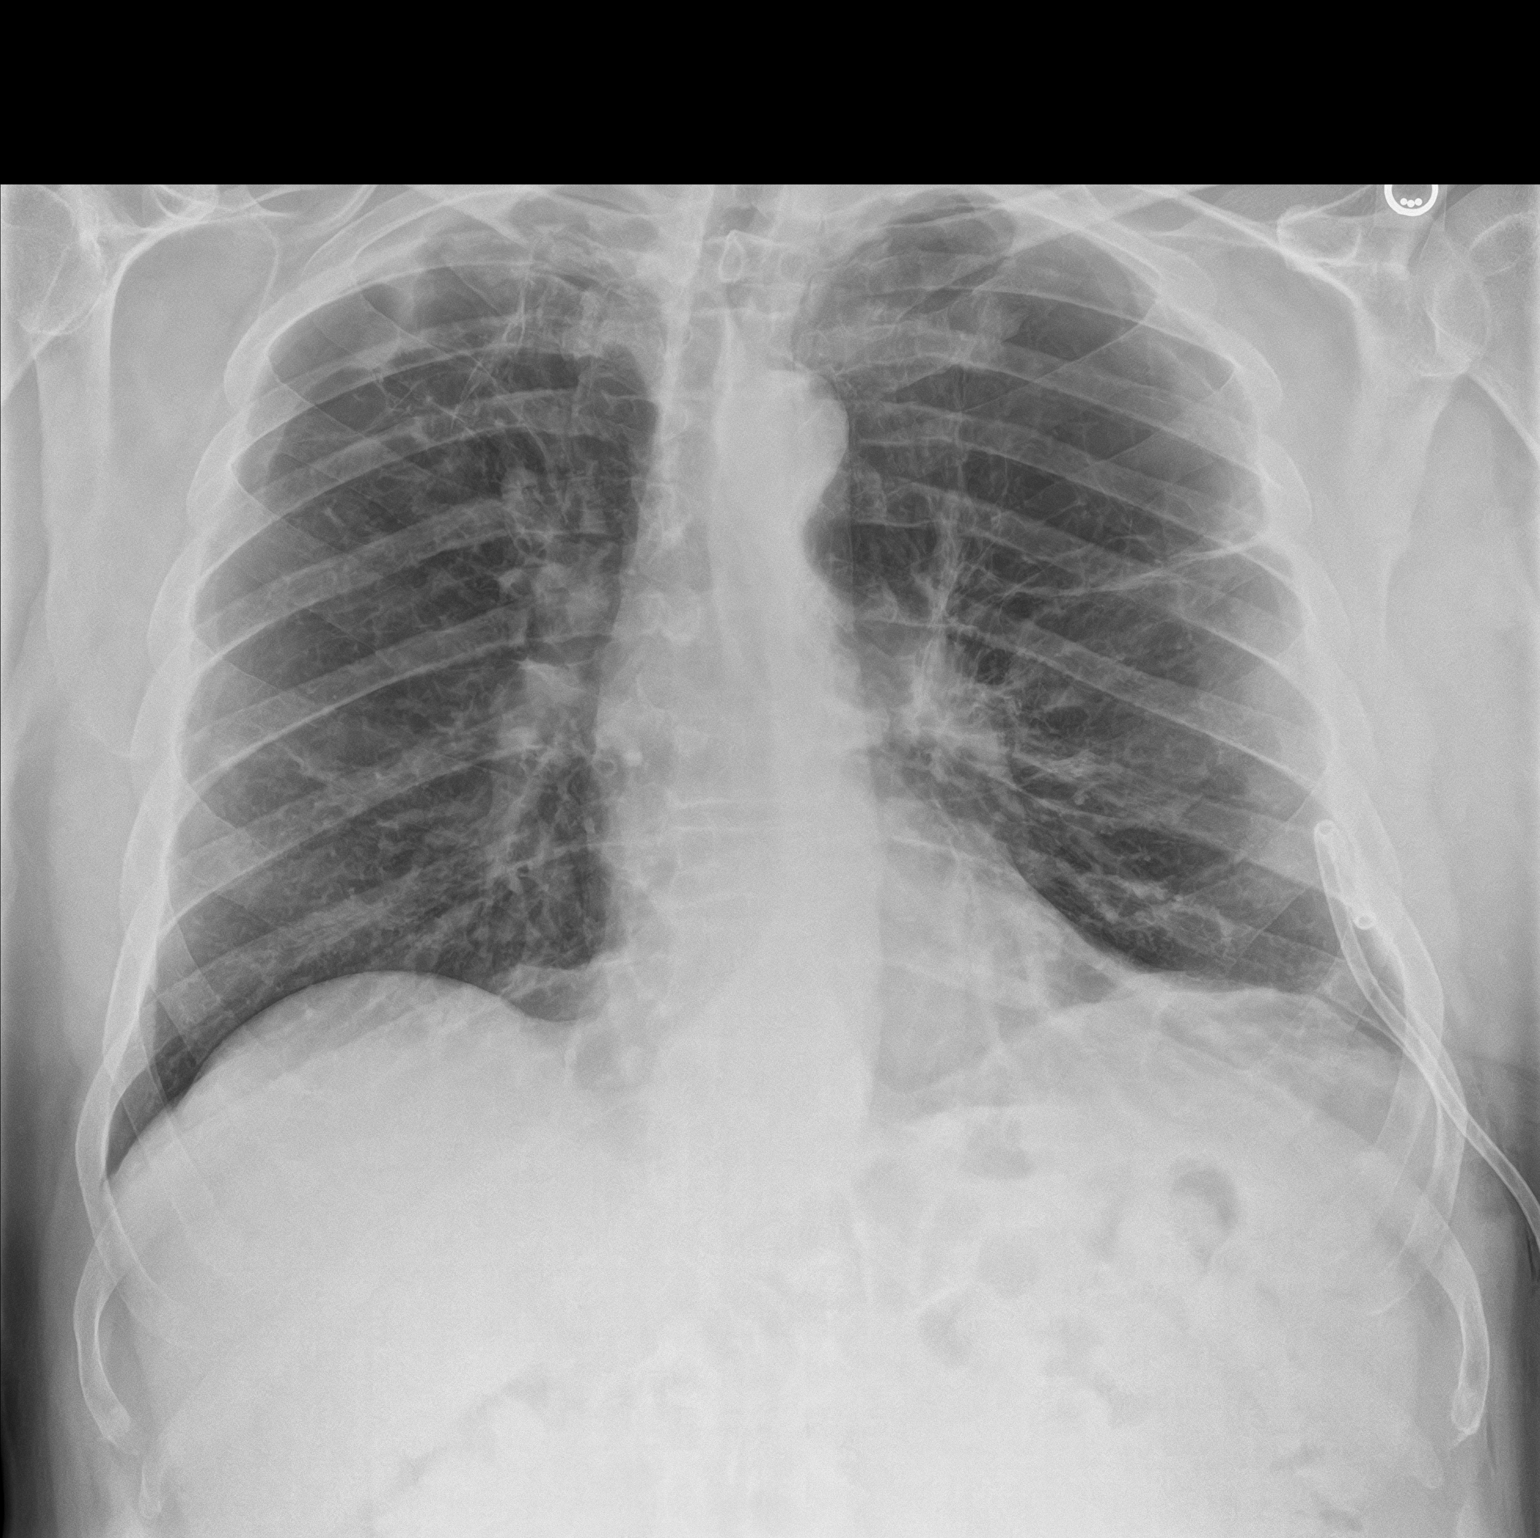
[im 2/2]
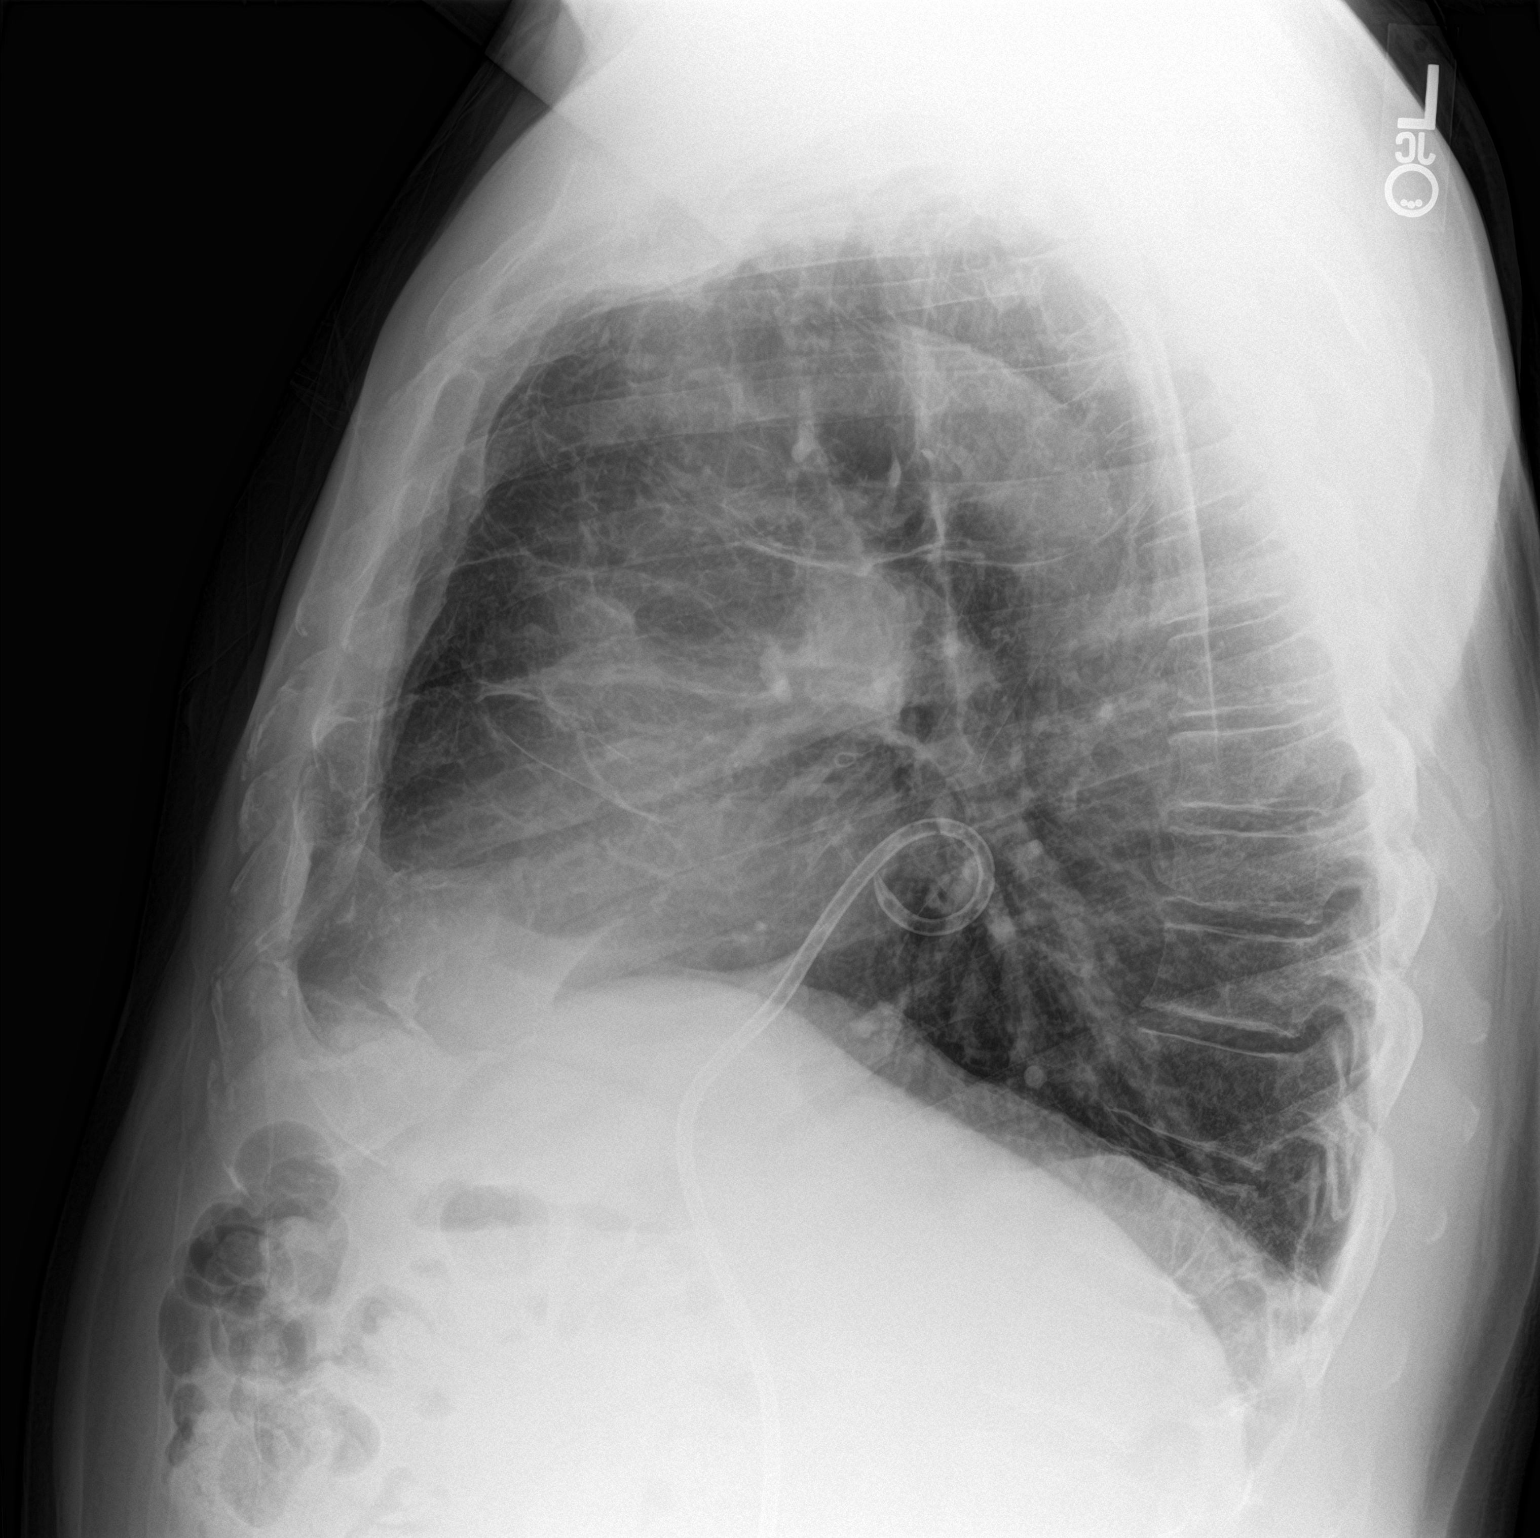

[2 of 2 positions shown; findings below may reference images not displayed]

FINDINGS: PA and lateral views. Stable left chest tube. Bullous emphysema with
architectural distortion about the left hilum. Probable trace
residual pneumothorax as seen by CT yesterday, less apparent from
4404 hours yesterday.

Stable cardiac size and mediastinal contours. No new pulmonary
opacity. Visualized tracheal air column is within normal limits. No
acute osseous abnormality identified. Negative visible bowel gas
pattern.
IMPRESSION: 1. Stable left chest tube and trace residual left pneumothorax as
seen by CT yesterday.
2. Bullous emphysema. No new cardiopulmonary abnormality.
